# Patient Record
Sex: Female | Born: 1950 | Race: White | Hispanic: No | Marital: Married | Smoking: Never smoker
Health system: Southern US, Community
[De-identification: ages and names within clinical notes are randomized; demographics above are authoritative.]

## PROBLEM LIST (undated history)

## (undated) DIAGNOSIS — E119 Type 2 diabetes mellitus without complications: Secondary | ICD-10-CM

## (undated) DIAGNOSIS — G629 Polyneuropathy, unspecified: Secondary | ICD-10-CM

## (undated) DIAGNOSIS — E7800 Pure hypercholesterolemia, unspecified: Secondary | ICD-10-CM

## (undated) DIAGNOSIS — F419 Anxiety disorder, unspecified: Secondary | ICD-10-CM

## (undated) DIAGNOSIS — E139 Other specified diabetes mellitus without complications: Secondary | ICD-10-CM

## (undated) DIAGNOSIS — F32A Depression, unspecified: Secondary | ICD-10-CM

## (undated) DIAGNOSIS — L900 Lichen sclerosus et atrophicus: Secondary | ICD-10-CM

## (undated) DIAGNOSIS — K219 Gastro-esophageal reflux disease without esophagitis: Secondary | ICD-10-CM

## (undated) DIAGNOSIS — N952 Postmenopausal atrophic vaginitis: Secondary | ICD-10-CM

## (undated) HISTORY — PX: ABDOMINAL HYSTERECTOMY: SHX81

## (undated) HISTORY — DX: Depression, unspecified: F32.A

## (undated) HISTORY — PX: TONSILLECTOMY: SUR1361

## (undated) HISTORY — DX: Pure hypercholesterolemia, unspecified: E78.00

## (undated) HISTORY — DX: Gastro-esophageal reflux disease without esophagitis: K21.9

## (undated) HISTORY — DX: Postmenopausal atrophic vaginitis: N95.2

## (undated) HISTORY — DX: Type 2 diabetes mellitus without complications: E11.9

## (undated) HISTORY — DX: Polyneuropathy, unspecified: G62.9

## (undated) HISTORY — DX: Lichen sclerosus et atrophicus: L90.0

## (undated) HISTORY — DX: Anxiety disorder, unspecified: F41.9

---

## 2002-10-02 ENCOUNTER — Encounter: Admission: RE | Admit: 2002-10-02 | Discharge: 2002-10-02 | Payer: Self-pay | Admitting: Family Medicine

## 2002-10-02 ENCOUNTER — Encounter: Payer: Self-pay | Admitting: Family Medicine

## 2002-10-15 ENCOUNTER — Encounter: Admission: RE | Admit: 2002-10-15 | Discharge: 2003-01-13 | Payer: Self-pay | Admitting: Family Medicine

## 2003-02-04 ENCOUNTER — Encounter: Admission: RE | Admit: 2003-02-04 | Discharge: 2003-05-05 | Payer: Self-pay | Admitting: Family Medicine

## 2004-02-26 ENCOUNTER — Ambulatory Visit (HOSPITAL_COMMUNITY): Admission: RE | Admit: 2004-02-26 | Discharge: 2004-02-26 | Payer: Self-pay | Admitting: Family Medicine

## 2004-07-14 ENCOUNTER — Encounter: Admission: RE | Admit: 2004-07-14 | Discharge: 2004-07-14 | Payer: Self-pay | Admitting: Family Medicine

## 2006-03-22 ENCOUNTER — Ambulatory Visit (HOSPITAL_COMMUNITY): Admission: RE | Admit: 2006-03-22 | Discharge: 2006-03-22 | Payer: Self-pay | Admitting: Family Medicine

## 2008-06-11 ENCOUNTER — Ambulatory Visit (HOSPITAL_COMMUNITY): Admission: RE | Admit: 2008-06-11 | Discharge: 2008-06-11 | Payer: Self-pay | Admitting: Family Medicine

## 2010-02-10 ENCOUNTER — Other Ambulatory Visit: Admission: RE | Admit: 2010-02-10 | Discharge: 2010-02-10 | Payer: Self-pay | Admitting: Family Medicine

## 2010-02-12 ENCOUNTER — Ambulatory Visit (HOSPITAL_COMMUNITY): Admission: RE | Admit: 2010-02-12 | Discharge: 2010-02-12 | Payer: Self-pay | Admitting: Family Medicine

## 2012-08-24 ENCOUNTER — Other Ambulatory Visit (HOSPITAL_COMMUNITY): Payer: Self-pay | Admitting: Physician Assistant

## 2012-08-24 DIAGNOSIS — Z1231 Encounter for screening mammogram for malignant neoplasm of breast: Secondary | ICD-10-CM

## 2012-09-03 ENCOUNTER — Ambulatory Visit (HOSPITAL_COMMUNITY): Admission: RE | Admit: 2012-09-03 | Payer: Self-pay | Source: Ambulatory Visit

## 2012-09-06 ENCOUNTER — Ambulatory Visit (HOSPITAL_COMMUNITY): Payer: Self-pay

## 2012-09-07 ENCOUNTER — Ambulatory Visit (HOSPITAL_COMMUNITY)
Admission: RE | Admit: 2012-09-07 | Discharge: 2012-09-07 | Disposition: A | Discharge status: Home / Self Care | Payer: 59 | Source: Ambulatory Visit | Attending: Physician Assistant | Admitting: Physician Assistant

## 2012-09-07 DIAGNOSIS — Z1231 Encounter for screening mammogram for malignant neoplasm of breast: Secondary | ICD-10-CM | POA: Insufficient documentation

## 2013-08-21 ENCOUNTER — Other Ambulatory Visit (HOSPITAL_COMMUNITY): Payer: Self-pay | Admitting: Physician Assistant

## 2013-08-21 DIAGNOSIS — Z1231 Encounter for screening mammogram for malignant neoplasm of breast: Secondary | ICD-10-CM

## 2013-09-09 ENCOUNTER — Other Ambulatory Visit (HOSPITAL_COMMUNITY): Payer: Self-pay | Admitting: Physician Assistant

## 2013-09-09 ENCOUNTER — Ambulatory Visit (HOSPITAL_COMMUNITY)
Admission: RE | Admit: 2013-09-09 | Discharge: 2013-09-09 | Disposition: A | Discharge status: Home / Self Care | Payer: 59 | Source: Ambulatory Visit | Attending: Physician Assistant | Admitting: Physician Assistant

## 2013-09-09 DIAGNOSIS — Z1231 Encounter for screening mammogram for malignant neoplasm of breast: Secondary | ICD-10-CM

## 2013-10-28 ENCOUNTER — Other Ambulatory Visit: Payer: Self-pay | Admitting: Orthopedic Surgery

## 2013-10-28 DIAGNOSIS — M25562 Pain in left knee: Secondary | ICD-10-CM

## 2013-10-30 ENCOUNTER — Ambulatory Visit
Admission: RE | Admit: 2013-10-30 | Discharge: 2013-10-30 | Disposition: A | Discharge status: Home / Self Care | Payer: No Typology Code available for payment source | Source: Ambulatory Visit | Attending: Orthopedic Surgery | Admitting: Orthopedic Surgery

## 2013-10-30 DIAGNOSIS — M25562 Pain in left knee: Secondary | ICD-10-CM

## 2014-04-22 DIAGNOSIS — Z0000 Encounter for general adult medical examination without abnormal findings: Secondary | ICD-10-CM | POA: Diagnosis not present

## 2014-04-22 DIAGNOSIS — F329 Major depressive disorder, single episode, unspecified: Secondary | ICD-10-CM | POA: Diagnosis not present

## 2014-04-22 DIAGNOSIS — E119 Type 2 diabetes mellitus without complications: Secondary | ICD-10-CM | POA: Diagnosis not present

## 2014-04-22 DIAGNOSIS — E7800 Pure hypercholesterolemia, unspecified: Secondary | ICD-10-CM | POA: Diagnosis not present

## 2014-04-22 DIAGNOSIS — Z23 Encounter for immunization: Secondary | ICD-10-CM | POA: Diagnosis not present

## 2014-04-22 DIAGNOSIS — F411 Generalized anxiety disorder: Secondary | ICD-10-CM | POA: Diagnosis not present

## 2014-05-29 ENCOUNTER — Encounter (HOSPITAL_COMMUNITY): Payer: Self-pay | Admitting: Emergency Medicine

## 2014-05-29 ENCOUNTER — Emergency Department (HOSPITAL_COMMUNITY): Payer: Medicare Other

## 2014-05-29 ENCOUNTER — Emergency Department (HOSPITAL_COMMUNITY)
Admission: EM | Admit: 2014-05-29 | Discharge: 2014-05-29 | Disposition: A | Discharge status: Home / Self Care | Payer: Medicare Other | Attending: Emergency Medicine | Admitting: Emergency Medicine

## 2014-05-29 DIAGNOSIS — R609 Edema, unspecified: Secondary | ICD-10-CM | POA: Insufficient documentation

## 2014-05-29 DIAGNOSIS — E119 Type 2 diabetes mellitus without complications: Secondary | ICD-10-CM | POA: Diagnosis not present

## 2014-05-29 DIAGNOSIS — H538 Other visual disturbances: Secondary | ICD-10-CM | POA: Diagnosis not present

## 2014-05-29 DIAGNOSIS — R079 Chest pain, unspecified: Secondary | ICD-10-CM

## 2014-05-29 DIAGNOSIS — M791 Myalgia: Secondary | ICD-10-CM | POA: Insufficient documentation

## 2014-05-29 DIAGNOSIS — H539 Unspecified visual disturbance: Secondary | ICD-10-CM

## 2014-05-29 DIAGNOSIS — R51 Headache: Secondary | ICD-10-CM | POA: Insufficient documentation

## 2014-05-29 DIAGNOSIS — N644 Mastodynia: Secondary | ICD-10-CM | POA: Diagnosis not present

## 2014-05-29 DIAGNOSIS — R110 Nausea: Secondary | ICD-10-CM | POA: Insufficient documentation

## 2014-05-29 DIAGNOSIS — R42 Dizziness and giddiness: Secondary | ICD-10-CM | POA: Diagnosis not present

## 2014-05-29 DIAGNOSIS — R05 Cough: Secondary | ICD-10-CM | POA: Diagnosis not present

## 2014-05-29 DIAGNOSIS — Z79899 Other long term (current) drug therapy: Secondary | ICD-10-CM | POA: Insufficient documentation

## 2014-05-29 HISTORY — DX: Other specified diabetes mellitus without complications: E13.9

## 2014-05-29 LAB — PRO B NATRIURETIC PEPTIDE: Pro B Natriuretic peptide (BNP): 54.5 pg/mL (ref 0–125)

## 2014-05-29 LAB — COMPREHENSIVE METABOLIC PANEL
ALT: 16 U/L (ref 0–35)
AST: 21 U/L (ref 0–37)
Albumin: 3.7 g/dL (ref 3.5–5.2)
Alkaline Phosphatase: 73 U/L (ref 39–117)
Anion gap: 16 — ABNORMAL HIGH (ref 5–15)
BUN: 15 mg/dL (ref 6–23)
CO2: 23 mEq/L (ref 19–32)
Calcium: 8.8 mg/dL (ref 8.4–10.5)
Chloride: 102 mEq/L (ref 96–112)
Creatinine, Ser: 0.83 mg/dL (ref 0.50–1.10)
GFR calc Af Amer: 84 mL/min — ABNORMAL LOW (ref >90)
GFR calc non Af Amer: 72 mL/min — ABNORMAL LOW (ref >90)
Glucose, Bld: 216 mg/dL — ABNORMAL HIGH (ref 70–99)
Potassium: 3.4 mEq/L — ABNORMAL LOW (ref 3.7–5.3)
Sodium: 141 mEq/L (ref 137–147)
Total Bilirubin: 0.2 mg/dL — ABNORMAL LOW (ref 0.3–1.2)
Total Protein: 7 g/dL (ref 6.0–8.3)

## 2014-05-29 LAB — URINALYSIS, ROUTINE W REFLEX MICROSCOPIC
Bilirubin Urine: NEGATIVE
Glucose, UA: NEGATIVE mg/dL
Ketones, ur: NEGATIVE mg/dL
Leukocytes, UA: NEGATIVE
Nitrite: NEGATIVE
Protein, ur: NEGATIVE mg/dL
Specific Gravity, Urine: 1.003 — ABNORMAL LOW (ref 1.005–1.030)
Urobilinogen, UA: 0.2 mg/dL (ref 0.0–1.0)
pH: 6 (ref 5.0–8.0)

## 2014-05-29 LAB — I-STAT TROPONIN, ED: Troponin i, poc: 0.01 ng/mL (ref 0.00–0.08)

## 2014-05-29 LAB — URINE MICROSCOPIC-ADD ON

## 2014-05-29 LAB — CBC
HCT: 35.3 % — ABNORMAL LOW (ref 36.0–46.0)
Hemoglobin: 11.9 g/dL — ABNORMAL LOW (ref 12.0–15.0)
MCH: 32.1 pg (ref 26.0–34.0)
MCHC: 33.7 g/dL (ref 30.0–36.0)
MCV: 95.1 fL (ref 78.0–100.0)
Platelets: 203 10*3/uL (ref 150–400)
RBC: 3.71 MIL/uL — ABNORMAL LOW (ref 3.87–5.11)
RDW: 13.3 % (ref 11.5–15.5)
WBC: 6.3 10*3/uL (ref 4.0–10.5)

## 2014-05-29 NOTE — Discharge Instructions (Signed)
Dizziness °Dizziness is a common problem. It is a feeling of unsteadiness or light-headedness. You may feel like you are about to faint. Dizziness can lead to injury if you stumble or fall. A person of any age group can suffer from dizziness, but dizziness is more common in older adults. °CAUSES  °Dizziness can be caused by many different things, including: °· Middle ear problems. °· Standing for too long. °· Infections. °· An allergic reaction. °· Aging. °· An emotional response to something, such as the sight of blood. °· Side effects of medicines. °· Tiredness. °· Problems with circulation or blood pressure. °· Excessive use of alcohol or medicines, or illegal drug use. °· Breathing too fast (hyperventilation). °· An irregular heart rhythm (arrhythmia). °· A low red blood cell count (anemia). °· Pregnancy. °· Vomiting, diarrhea, fever, or other illnesses that cause body fluid loss (dehydration). °· Diseases or conditions such as Parkinson's disease, high blood pressure (hypertension), diabetes, and thyroid problems. °· Exposure to extreme heat. °DIAGNOSIS  °Your health care provider will ask about your symptoms, perform a physical exam, and perform an electrocardiogram (ECG) to record the electrical activity of your heart. Your health care provider may also perform other heart or blood tests to determine the cause of your dizziness. These may include: °· Transthoracic echocardiogram (TTE). During echocardiography, sound waves are used to evaluate how blood flows through your heart. °· Transesophageal echocardiogram (TEE). °· Cardiac monitoring. This allows your health care provider to monitor your heart rate and rhythm in real time. °· Holter monitor. This is a portable device that records your heartbeat and can help diagnose heart arrhythmias. It allows your health care provider to track your heart activity for several days if needed. °· Stress tests by exercise or by giving medicine that makes the heart beat  faster. °TREATMENT  °Treatment of dizziness depends on the cause of your symptoms and can vary greatly. °HOME CARE INSTRUCTIONS  °· Drink enough fluids to keep your urine clear or pale yellow. This is especially important in very hot weather. In older adults, it is also important in cold weather. °· Take your medicine exactly as directed if your dizziness is caused by medicines. When taking blood pressure medicines, it is especially important to get up slowly. °¨ Rise slowly from chairs and steady yourself until you feel okay. °¨ In the morning, first sit up on the side of the bed. When you feel okay, stand slowly while holding onto something until you know your balance is fine. °· Move your legs often if you need to stand in one place for a long time. Tighten and relax your muscles in your legs while standing. °· Have someone stay with you for 1-2 days if dizziness continues to be a problem. Do this until you feel you are well enough to stay alone. Have the person call your health care provider if he or she notices changes in you that are concerning. °· Do not drive or use heavy machinery if you feel dizzy. °· Do not drink alcohol. °SEEK IMMEDIATE MEDICAL CARE IF:  °· Your dizziness or light-headedness gets worse. °· You feel nauseous or vomit. °· You have problems talking, walking, or using your arms, hands, or legs. °· You feel weak. °· You are not thinking clearly or you have trouble forming sentences. It may take a friend or family member to notice this. °· You have chest pain, abdominal pain, shortness of breath, or sweating. °· Your vision changes. °· You notice   any bleeding.  You have side effects from medicine that seems to be getting worse rather than better. MAKE SURE YOU:   Understand these instructions.  Will watch your condition.  Will get help right away if you are not doing well or get worse. Document Released: 01/11/2001 Document Revised: 07/23/2013 Document Reviewed: 02/04/2011 [facility]  Patient Information 2015 [place], [place]. This information is not intended to replace advice given to you by your health care provider. Make sure you discuss any questions you have with your health care provider. Chest Wall Pain Chest wall pain is pain in or around the bones and muscles of your chest. It may take up to 6 weeks to get better. It may take longer if you must stay physically active in your work and activities.  CAUSES  Chest wall pain may happen on its own. However, it may be caused by:  A viral illness like the flu.  Injury.  Coughing.  Exercise.  Arthritis.  Fibromyalgia.  Shingles. HOME CARE INSTRUCTIONS   Avoid overtiring physical activity. Try not to strain or perform activities that cause pain. This includes any activities using your chest or your abdominal and side muscles, especially if heavy weights are used.  Put ice on the sore area.  Put ice in a plastic bag.  Place a towel between your skin and the bag.  Leave the ice on for 15-20 minutes per hour while awake for the first 2 days.  Only take over-the-counter or prescription medicines for pain, discomfort, or fever as directed by your caregiver. SEEK IMMEDIATE MEDICAL CARE IF:   Your pain increases, or you are very uncomfortable.  You have a fever.  Your chest pain becomes worse.  You have new, unexplained symptoms.  You have nausea or vomiting.  You feel sweaty or lightheaded.  You have a cough with phlegm (sputum), or you cough up blood. MAKE SURE YOU:   Understand these instructions.  Will watch your condition.  Will get help right away if you are not doing well or get worse. Document Released: 07/18/2005 Document Revised: 10/10/2011 Document Reviewed: 03/14/2011 [facility] Patient Information 2015 [place], [place]. This information is not intended to replace advice given to you by your health care provider. Make sure you discuss any questions you have with your health care  provider. Visual Disturbances You have had a disturbance in your vision. This may be caused by various conditions, such as:  Migraines. Migraine headaches are often preceded by a disturbance in vision. Blind spots or light flashes are followed by a headache. This type of visual disturbance is temporary. It does not damage the eye.  Glaucoma. This is caused by increased pressure in the eye. Symptoms include haziness, blurred vision, or seeing rainbow colored circles when looking at bright lights. Partial or complete visual loss can occur. You may or may not experience eye pain. Visual loss may be gradual or sudden and is irreversible. Glaucoma is the leading cause of blindness.  Retina problems. Vision will be reduced if the retina becomes detached or if there is a circulation problem as with diabetes, high blood pressure, or a mini-stroke. Symptoms include seeing "floaters," flashes of light, or shadows, as if a curtain has fallen over your eye.  Optic nerve problems. The main nerve in your eye can be damaged by redness, soreness, and swelling (inflammation), poor circulation, drugs, and toxins. It is very important to have a complete exam done by a specialist to determine the exact cause of your eye problem.  The specialist may recommend medicines or surgery, depending on the cause of the problem. This can help prevent further loss of vision or reduce the risk of having a stroke. Contact the caregiver to whom you have been referred and arrange for follow-up care right away. SEEK IMMEDIATE MEDICAL CARE IF:   Your vision gets worse.  You develop severe headaches.  You have any weakness or numbness in the face, arms, or legs.  You have any trouble speaking or walking. Document Released: 08/25/2004 Document Revised: 10/10/2011 Document Reviewed: 12/16/2009 [facility] Patient Information 2015 [place], [place]. This information is not intended to replace advice given to you by your health care provider.  Make sure you discuss any questions you have with your health care provider.

## 2014-05-29 NOTE — ED Notes (Signed)
Per pt she is experiencing some dizziness, headache, and pain under her right breast.

## 2014-05-29 NOTE — ED Provider Notes (Signed)
CSN: [id number]     Arrival date & time 05/29/14  [id number] History   First MD Initiated Contact with Patient 05/29/14 0840     Chief Complaint  Patient presents with  . Dizziness  . Breast Pain     (Consider location/radiation/quality/duration/timing/severity/associated sxs/prior Treatment) Patient is a 63 y.o. female presenting with dizziness. The history is provided by the patient.  Dizziness Quality:  Lightheadedness Severity:  Moderate Onset quality:  Gradual Duration:  2 days Timing:  Constant Progression:  Worsening Chronicity:  New Context: bending over   Relieved by:  Nothing Worsened by:  Nothing tried Ineffective treatments:  None tried Associated symptoms: headaches, nausea and vision changes   Risk factors: anemia   Risk factors: no hx of stroke and no new medications   Pt reports she was looking at black pants yesterday and they looked purple paisley print to her.  Pt reports she saw spots of brown in water and thought something was wrong with water.  Pt reports he husband did not see the spots.  Pt reports she has felt dizzy.  Pt reports floor feels elevated.  Pt reports she walked into door facing.    Pt also complains of soreness under her right breast.    Past Medical History  Diagnosis Date  . Diabetes 1.5, managed as type 2    Past Surgical History  Procedure Laterality Date  . Tonsillectomy    . Abdominal hysterectomy     No family history on file. History  Substance Use Topics  . Smoking status: Not on file  . Smokeless tobacco: Not on file  . Alcohol Use: Not on file   OB History   Grav Para Term Preterm Abortions TAB SAB Ect Mult Living                 Review of Systems  Gastrointestinal: Positive for nausea.  Musculoskeletal: Positive for myalgias.  Neurological: Positive for dizziness and headaches.  All other systems reviewed and are negative.     Allergies  Codeine  Home Medications   Prior to Admission medications   Medication  Sig Start Date End Date Taking? Authorizing Provider  buPROPion (WELLBUTRIN XL) 300 MG 24 hr tablet Take 300 mg by mouth daily.   Yes Historical Provider, MD  ibuprofen (ADVIL,MOTRIN) 200 MG tablet Take 200 mg by mouth every 6 (six) hours as needed (pain).   Yes Historical Provider, MD  metFORMIN (GLUCOPHAGE) 500 MG tablet Take 500 mg by mouth 2 (two) times daily with a meal.   Yes Historical Provider, MD  omeprazole (PRILOSEC) 40 MG capsule Take 40 mg by mouth daily.   Yes Historical Provider, MD  simvastatin (ZOCOR) 40 MG tablet Take 40 mg by mouth daily.   Yes Historical Provider, MD  sitaGLIPtin (JANUVIA) 100 MG tablet Take 100 mg by mouth daily.   Yes Historical Provider, MD   BP 150/62  Pulse 84  Temp(Src) 97.8 F (36.6 C) (Oral)  Resp 18  SpO2 96% Physical Exam  Nursing note and vitals reviewed. Constitutional: She is oriented to person, place, and time. She appears well-developed and well-nourished.  HENT:  Head: Normocephalic.  Right Ear: External ear normal.  Left Ear: External ear normal.  Nose: Nose normal.  Mouth/Throat: Oropharynx is clear and moist.  Eyes: Conjunctivae and EOM are normal. Pupils are equal, round, and reactive to light.  Neck: Normal range of motion.  Cardiovascular: Normal rate, regular rhythm and normal heart sounds.   Pulmonary/Chest: Effort normal  and breath sounds normal.  Abdominal: She exhibits no distension.  Musculoskeletal: She exhibits edema. She exhibits no tenderness.  Slight edema bilat feet  Neurological: She is alert and oriented to person, place, and time.  Skin: Skin is warm.  Psychiatric: She has a normal mood and affect.    ED Course  Procedures (including critical care time) Labs Review Labs Reviewed  CBC - Abnormal; Notable for the following:    RBC 3.71 (*)    Hemoglobin 11.9 (*)    HCT 35.3 (*)    All other components within normal limits  COMPREHENSIVE METABOLIC PANEL - Abnormal; Notable for the following:     Potassium 3.4 (*)    Glucose, Bld 216 (*)    Total Bilirubin 0.2 (*)    GFR calc non Af Amer 72 (*)    GFR calc Af Amer 84 (*)    Anion gap 16 (*)    All other components within normal limits  URINALYSIS, ROUTINE W REFLEX MICROSCOPIC - Abnormal; Notable for the following:    Specific Gravity, Urine 1.003 (*)    Hgb urine dipstick SMALL (*)    All other components within normal limits  PRO B NATRIURETIC PEPTIDE  URINE MICROSCOPIC-ADD ON  Rosezena Sensor, ED    Imaging Review Dg Chest 2 View  05/29/2014   CLINICAL DATA:  63 year old female with chest pain under both breasts (right greater than left). Cough for the past 2 weeks.  EXAM: CHEST  2 VIEW  COMPARISON:  Chest x-ray 07/15/2010.  FINDINGS: Lung volumes are normal. No consolidative airspace disease. No pleural effusions. No pneumothorax. No pulmonary nodule or mass noted. Pulmonary vasculature and the cardiomediastinal silhouette are within normal limits.  IMPRESSION: No radiographic evidence of acute cardiopulmonary disease.   Electronically Signed   By: Trudie Reed M.D.   On: 05/29/2014 09:39   Mr Brain Wo Contrast  05/29/2014   CLINICAL DATA:  Dizziness, headache, and pain under RIGHT breast. Initial encounter.  EXAM: MRI HEAD WITHOUT CONTRAST  TECHNIQUE: Multiplanar, multiecho pulse sequences of the brain and surrounding structures were obtained without intravenous contrast.  COMPARISON:  None.  FINDINGS: The patient was unable to remain motionless for the exam. Small or subtle lesions could be overlooked.  No evidence for acute infarction, hemorrhage, mass lesion, hydrocephalus, or extra-axial fluid. Normal for age cerebral volume. Mild subcortical and periventricular T2 and FLAIR hyperintensities, likely chronic microvascular ischemic change.  Pituitary, pineal, and cerebellar tonsils unremarkable. No upper cervical lesions. Flow voids are maintained throughout the carotid, basilar, and vertebral arteries. There are no areas of  chronic hemorrhage.  Visualized calvarium, skull base, and upper cervical osseous structures unremarkable. Scalp and extracranial soft tissues, orbits, sinuses, and mastoids show no acute process.  IMPRESSION: Mild age-related atrophy. Mild subcortical and periventricular T2 and FLAIR hyperintensities, likely chronic microvascular ischemic change.  No acute intracranial findings.   Electronically Signed   By: Davonna Belling M.D.   On: 05/29/2014 12:57     EKG Interpretation   Date/Time:  Thursday May 29 2014 08:44:29 EDT Ventricular Rate:  84 PR Interval:  188 QRS Duration: 84 QT Interval:  402 QTC Calculation: 475 R Axis:   48 Text Interpretation:  Sinus rhythm Low voltage, precordial leads Abnormal  R-wave progression, early transition No old tracing to compare Confirmed  by [facility]  MD, ELLIOTT [phone]) on 05/29/2014 9:55:35 AM     Results for orders placed during the hospital encounter of 05/29/14  CBC  Result Value Ref Range   WBC 6.3  4.0 - 10.5 K/uL   RBC 3.71 (*) 3.87 - 5.11 MIL/uL   Hemoglobin 11.9 (*) 12.0 - 15.0 g/dL   HCT 09.8 (*) 11.9 - 14.7 %   MCV 95.1  78.0 - 100.0 fL   MCH 32.1  26.0 - 34.0 pg   MCHC 33.7  30.0 - 36.0 g/dL   RDW 82.9  56.2 - 13.0 %   Platelets 203  150 - 400 K/uL  PRO B NATRIURETIC PEPTIDE      Result Value Ref Range   Pro B Natriuretic peptide (BNP) 54.5  0 - 125 pg/mL  COMPREHENSIVE METABOLIC PANEL      Result Value Ref Range   Sodium 141  137 - 147 mEq/L   Potassium 3.4 (*) 3.7 - 5.3 mEq/L   Chloride 102  96 - 112 mEq/L   CO2 23  19 - 32 mEq/L   Glucose, Bld 216 (*) 70 - 99 mg/dL   BUN 15  6 - 23 mg/dL   Creatinine, Ser 8.65  0.50 - 1.10 mg/dL   Calcium 8.8  8.4 - 78.4 mg/dL   Total Protein 7.0  6.0 - 8.3 g/dL   Albumin 3.7  3.5 - 5.2 g/dL   AST 21  0 - 37 U/L   ALT 16  0 - 35 U/L   Alkaline Phosphatase 73  39 - 117 U/L   Total Bilirubin 0.2 (*) 0.3 - 1.2 mg/dL   GFR calc non Af Amer 72 (*) >90 mL/min   GFR calc Af Amer 84 (*)  >90 mL/min   Anion gap 16 (*) 5 - 15  URINALYSIS, ROUTINE W REFLEX MICROSCOPIC      Result Value Ref Range   Color, Urine YELLOW  YELLOW   APPearance CLEAR  CLEAR   Specific Gravity, Urine 1.003 (*) 1.005 - 1.030   pH 6.0  5.0 - 8.0   Glucose, UA NEGATIVE  NEGATIVE mg/dL   Hgb urine dipstick SMALL (*) NEGATIVE   Bilirubin Urine NEGATIVE  NEGATIVE   Ketones, ur NEGATIVE  NEGATIVE mg/dL   Protein, ur NEGATIVE  NEGATIVE mg/dL   Urobilinogen, UA 0.2  0.0 - 1.0 mg/dL   Nitrite NEGATIVE  NEGATIVE   Leukocytes, UA NEGATIVE  NEGATIVE  URINE MICROSCOPIC-ADD ON      Result Value Ref Range   Squamous Epithelial / LPF RARE  RARE   RBC / HPF 0-2  <3 RBC/hpf   Bacteria, UA RARE  RARE  I-STAT TROPOININ, ED      Result Value Ref Range   Troponin i, poc 0.01  0.00 - 0.08 ng/mL   Comment 3            Dg Chest 2 View  05/29/2014   CLINICAL DATA:  63 year old female with chest pain under both breasts (right greater than left). Cough for the past 2 weeks.  EXAM: CHEST  2 VIEW  COMPARISON:  Chest x-ray 07/15/2010.  FINDINGS: Lung volumes are normal. No consolidative airspace disease. No pleural effusions. No pneumothorax. No pulmonary nodule or mass noted. Pulmonary vasculature and the cardiomediastinal silhouette are within normal limits.  IMPRESSION: No radiographic evidence of acute cardiopulmonary disease.   Electronically Signed   By: Trudie Reed M.D.   On: 05/29/2014 09:39   Mr Brain Wo Contrast  05/29/2014   CLINICAL DATA:  Dizziness, headache, and pain under RIGHT breast. Initial encounter.  EXAM: MRI HEAD WITHOUT CONTRAST  TECHNIQUE: Multiplanar,  multiecho pulse sequences of the brain and surrounding structures were obtained without intravenous contrast.  COMPARISON:  None.  FINDINGS: The patient was unable to remain motionless for the exam. Small or subtle lesions could be overlooked.  No evidence for acute infarction, hemorrhage, mass lesion, hydrocephalus, or extra-axial fluid. Normal for  age cerebral volume. Mild subcortical and periventricular T2 and FLAIR hyperintensities, likely chronic microvascular ischemic change.  Pituitary, pineal, and cerebellar tonsils unremarkable. No upper cervical lesions. Flow voids are maintained throughout the carotid, basilar, and vertebral arteries. There are no areas of chronic hemorrhage.  Visualized calvarium, skull base, and upper cervical osseous structures unremarkable. Scalp and extracranial soft tissues, orbits, sinuses, and mastoids show no acute process.  IMPRESSION: Mild age-related atrophy. Mild subcortical and periventricular T2 and FLAIR hyperintensities, likely chronic microvascular ischemic change.  No acute intracranial findings.   Electronically Signed   By: Davonna BellingJohn  Curnes M.D.   On: 05/29/2014 12:57    MDM  Mria noraml   Glucose elevated at 216 Dr. Effie Shywentz in to see and examine.  Pt advised to follow up with her primary MD for recheck of blood pressure in 1 week.  Pt advised to see her Eye doctor for vision check.     Final diagnoses:  Chest pain  Visual changes       Elson AreasLeslie K Dewan Emond, [place] JerseyPA-C 05/29/14 1422

## 2014-05-29 NOTE — ED Provider Notes (Deleted)
Medical screening examination/treatment/procedure(s) were performed by non-physician practitioner and as supervising physician I was immediately available for consultation/collaboration.  Kevan Prouty L Malkia Nippert, MD 05/29/14 1723 

## 2014-05-29 NOTE — ED Provider Notes (Signed)
  Face-to-face evaluation   History: She reports onset of visual abnormality, yesterday her to rise by shapes and colors that are really there. She also noticed some soreness of her right anterior chest wall, today.   Physical exam: Alert, somewhat anxious, cooperative. No respiratory distress. Moves extremities equally.  Medical screening examination/treatment/procedure(s) were conducted as a shared visit with non-physician practitioner(s) and myself.  I personally evaluated the patient during the encounter  Flint MelterElliott L Teka Chanda, MD 05/29/14 1725

## 2014-06-05 DIAGNOSIS — R112 Nausea with vomiting, unspecified: Secondary | ICD-10-CM | POA: Diagnosis not present

## 2014-06-05 DIAGNOSIS — H539 Unspecified visual disturbance: Secondary | ICD-10-CM | POA: Diagnosis not present

## 2014-06-05 DIAGNOSIS — R42 Dizziness and giddiness: Secondary | ICD-10-CM | POA: Diagnosis not present

## 2014-10-21 DIAGNOSIS — F329 Major depressive disorder, single episode, unspecified: Secondary | ICD-10-CM | POA: Diagnosis not present

## 2014-10-21 DIAGNOSIS — Z01411 Encounter for gynecological examination (general) (routine) with abnormal findings: Secondary | ICD-10-CM | POA: Diagnosis not present

## 2014-10-21 DIAGNOSIS — F419 Anxiety disorder, unspecified: Secondary | ICD-10-CM | POA: Diagnosis not present

## 2014-10-21 DIAGNOSIS — Z0000 Encounter for general adult medical examination without abnormal findings: Secondary | ICD-10-CM | POA: Diagnosis not present

## 2014-10-21 DIAGNOSIS — Z6837 Body mass index (BMI) 37.0-37.9, adult: Secondary | ICD-10-CM | POA: Diagnosis not present

## 2014-10-21 DIAGNOSIS — K219 Gastro-esophageal reflux disease without esophagitis: Secondary | ICD-10-CM | POA: Diagnosis not present

## 2014-10-21 DIAGNOSIS — E780 Pure hypercholesterolemia: Secondary | ICD-10-CM | POA: Diagnosis not present

## 2014-10-21 DIAGNOSIS — E669 Obesity, unspecified: Secondary | ICD-10-CM | POA: Diagnosis not present

## 2014-10-21 DIAGNOSIS — Z1239 Encounter for other screening for malignant neoplasm of breast: Secondary | ICD-10-CM | POA: Diagnosis not present

## 2014-10-21 DIAGNOSIS — E119 Type 2 diabetes mellitus without complications: Secondary | ICD-10-CM | POA: Diagnosis not present

## 2014-11-19 DIAGNOSIS — N958 Other specified menopausal and perimenopausal disorders: Secondary | ICD-10-CM | POA: Diagnosis not present

## 2015-01-20 DIAGNOSIS — E119 Type 2 diabetes mellitus without complications: Secondary | ICD-10-CM | POA: Diagnosis not present

## 2015-01-20 DIAGNOSIS — Z794 Long term (current) use of insulin: Secondary | ICD-10-CM | POA: Diagnosis not present

## 2015-03-30 ENCOUNTER — Other Ambulatory Visit (HOSPITAL_COMMUNITY): Payer: Self-pay | Admitting: Physician Assistant

## 2015-03-30 DIAGNOSIS — Z1231 Encounter for screening mammogram for malignant neoplasm of breast: Secondary | ICD-10-CM

## 2015-04-01 ENCOUNTER — Ambulatory Visit (HOSPITAL_COMMUNITY)
Admission: RE | Admit: 2015-04-01 | Discharge: 2015-04-01 | Disposition: A | Discharge status: Home / Self Care | Payer: Medicare Other | Source: Ambulatory Visit | Attending: Physician Assistant | Admitting: Physician Assistant

## 2015-04-01 DIAGNOSIS — Z1231 Encounter for screening mammogram for malignant neoplasm of breast: Secondary | ICD-10-CM | POA: Diagnosis not present

## 2015-04-23 DIAGNOSIS — Z794 Long term (current) use of insulin: Secondary | ICD-10-CM | POA: Diagnosis not present

## 2015-04-23 DIAGNOSIS — Z23 Encounter for immunization: Secondary | ICD-10-CM | POA: Diagnosis not present

## 2015-04-23 DIAGNOSIS — E780 Pure hypercholesterolemia: Secondary | ICD-10-CM | POA: Diagnosis not present

## 2015-04-23 DIAGNOSIS — F411 Generalized anxiety disorder: Secondary | ICD-10-CM | POA: Diagnosis not present

## 2015-04-23 DIAGNOSIS — E1165 Type 2 diabetes mellitus with hyperglycemia: Secondary | ICD-10-CM | POA: Diagnosis not present

## 2015-04-23 DIAGNOSIS — F329 Major depressive disorder, single episode, unspecified: Secondary | ICD-10-CM | POA: Diagnosis not present

## 2015-12-01 DIAGNOSIS — F329 Major depressive disorder, single episode, unspecified: Secondary | ICD-10-CM | POA: Diagnosis not present

## 2015-12-01 DIAGNOSIS — K219 Gastro-esophageal reflux disease without esophagitis: Secondary | ICD-10-CM | POA: Diagnosis not present

## 2015-12-01 DIAGNOSIS — E7800 Pure hypercholesterolemia, unspecified: Secondary | ICD-10-CM | POA: Diagnosis not present

## 2015-12-01 DIAGNOSIS — E119 Type 2 diabetes mellitus without complications: Secondary | ICD-10-CM | POA: Diagnosis not present

## 2016-04-05 DIAGNOSIS — Z23 Encounter for immunization: Secondary | ICD-10-CM | POA: Diagnosis not present

## 2016-06-06 DIAGNOSIS — Z0000 Encounter for general adult medical examination without abnormal findings: Secondary | ICD-10-CM | POA: Diagnosis not present

## 2016-06-06 DIAGNOSIS — E7800 Pure hypercholesterolemia, unspecified: Secondary | ICD-10-CM | POA: Diagnosis not present

## 2016-06-06 DIAGNOSIS — F419 Anxiety disorder, unspecified: Secondary | ICD-10-CM | POA: Diagnosis not present

## 2016-06-06 DIAGNOSIS — K219 Gastro-esophageal reflux disease without esophagitis: Secondary | ICD-10-CM | POA: Diagnosis not present

## 2016-06-06 DIAGNOSIS — E119 Type 2 diabetes mellitus without complications: Secondary | ICD-10-CM | POA: Diagnosis not present

## 2016-06-06 DIAGNOSIS — F329 Major depressive disorder, single episode, unspecified: Secondary | ICD-10-CM | POA: Diagnosis not present

## 2016-07-27 DIAGNOSIS — Z1211 Encounter for screening for malignant neoplasm of colon: Secondary | ICD-10-CM | POA: Diagnosis not present

## 2016-07-27 DIAGNOSIS — K5730 Diverticulosis of large intestine without perforation or abscess without bleeding: Secondary | ICD-10-CM | POA: Diagnosis not present

## 2016-07-27 DIAGNOSIS — Z8371 Family history of colonic polyps: Secondary | ICD-10-CM | POA: Diagnosis not present

## 2016-07-27 DIAGNOSIS — K640 First degree hemorrhoids: Secondary | ICD-10-CM | POA: Diagnosis not present

## 2016-08-29 DIAGNOSIS — H109 Unspecified conjunctivitis: Secondary | ICD-10-CM | POA: Diagnosis not present

## 2016-11-29 DIAGNOSIS — E119 Type 2 diabetes mellitus without complications: Secondary | ICD-10-CM | POA: Diagnosis not present

## 2016-11-29 DIAGNOSIS — H43393 Other vitreous opacities, bilateral: Secondary | ICD-10-CM | POA: Diagnosis not present

## 2016-11-29 DIAGNOSIS — H2513 Age-related nuclear cataract, bilateral: Secondary | ICD-10-CM | POA: Diagnosis not present

## 2016-12-08 DIAGNOSIS — E7800 Pure hypercholesterolemia, unspecified: Secondary | ICD-10-CM | POA: Diagnosis not present

## 2016-12-08 DIAGNOSIS — E119 Type 2 diabetes mellitus without complications: Secondary | ICD-10-CM | POA: Diagnosis not present

## 2016-12-08 DIAGNOSIS — K219 Gastro-esophageal reflux disease without esophagitis: Secondary | ICD-10-CM | POA: Diagnosis not present

## 2016-12-08 DIAGNOSIS — F419 Anxiety disorder, unspecified: Secondary | ICD-10-CM | POA: Diagnosis not present

## 2016-12-08 DIAGNOSIS — F329 Major depressive disorder, single episode, unspecified: Secondary | ICD-10-CM | POA: Diagnosis not present

## 2017-03-15 DIAGNOSIS — Z794 Long term (current) use of insulin: Secondary | ICD-10-CM | POA: Diagnosis not present

## 2017-03-15 DIAGNOSIS — Z7984 Long term (current) use of oral hypoglycemic drugs: Secondary | ICD-10-CM | POA: Diagnosis not present

## 2017-03-15 DIAGNOSIS — E119 Type 2 diabetes mellitus without complications: Secondary | ICD-10-CM | POA: Diagnosis not present

## 2017-04-14 DIAGNOSIS — Z23 Encounter for immunization: Secondary | ICD-10-CM | POA: Diagnosis not present

## 2017-06-14 ENCOUNTER — Ambulatory Visit
Admission: RE | Admit: 2017-06-14 | Discharge: 2017-06-14 | Disposition: A | Discharge status: Home / Self Care | Payer: Medicare Other | Source: Ambulatory Visit | Attending: Physician Assistant | Admitting: Physician Assistant

## 2017-06-14 ENCOUNTER — Other Ambulatory Visit: Payer: Self-pay | Admitting: Physician Assistant

## 2017-06-14 DIAGNOSIS — E7800 Pure hypercholesterolemia, unspecified: Secondary | ICD-10-CM | POA: Diagnosis not present

## 2017-06-14 DIAGNOSIS — Z1231 Encounter for screening mammogram for malignant neoplasm of breast: Secondary | ICD-10-CM

## 2017-06-14 DIAGNOSIS — E1165 Type 2 diabetes mellitus with hyperglycemia: Secondary | ICD-10-CM | POA: Diagnosis not present

## 2017-06-14 DIAGNOSIS — F329 Major depressive disorder, single episode, unspecified: Secondary | ICD-10-CM | POA: Diagnosis not present

## 2017-06-14 DIAGNOSIS — Z1159 Encounter for screening for other viral diseases: Secondary | ICD-10-CM | POA: Diagnosis not present

## 2017-06-14 DIAGNOSIS — F419 Anxiety disorder, unspecified: Secondary | ICD-10-CM | POA: Diagnosis not present

## 2017-06-14 DIAGNOSIS — Z0000 Encounter for general adult medical examination without abnormal findings: Secondary | ICD-10-CM | POA: Diagnosis not present

## 2017-06-14 DIAGNOSIS — Z23 Encounter for immunization: Secondary | ICD-10-CM | POA: Diagnosis not present

## 2017-06-14 DIAGNOSIS — Z794 Long term (current) use of insulin: Secondary | ICD-10-CM | POA: Diagnosis not present

## 2017-06-14 DIAGNOSIS — Z780 Asymptomatic menopausal state: Secondary | ICD-10-CM | POA: Diagnosis not present

## 2017-06-14 DIAGNOSIS — K219 Gastro-esophageal reflux disease without esophagitis: Secondary | ICD-10-CM | POA: Diagnosis not present

## 2017-06-16 ENCOUNTER — Other Ambulatory Visit: Payer: Self-pay | Admitting: Physician Assistant

## 2017-06-16 DIAGNOSIS — Z139 Encounter for screening, unspecified: Secondary | ICD-10-CM

## 2017-08-17 DIAGNOSIS — J069 Acute upper respiratory infection, unspecified: Secondary | ICD-10-CM | POA: Diagnosis not present

## 2017-10-16 DIAGNOSIS — H6061 Unspecified chronic otitis externa, right ear: Secondary | ICD-10-CM | POA: Diagnosis not present

## 2017-10-16 DIAGNOSIS — H6521 Chronic serous otitis media, right ear: Secondary | ICD-10-CM | POA: Diagnosis not present

## 2017-10-16 DIAGNOSIS — H6691 Otitis media, unspecified, right ear: Secondary | ICD-10-CM | POA: Diagnosis not present

## 2017-10-16 DIAGNOSIS — H6121 Impacted cerumen, right ear: Secondary | ICD-10-CM | POA: Diagnosis not present

## 2017-10-20 DIAGNOSIS — H6121 Impacted cerumen, right ear: Secondary | ICD-10-CM | POA: Diagnosis not present

## 2017-12-12 DIAGNOSIS — Z794 Long term (current) use of insulin: Secondary | ICD-10-CM | POA: Diagnosis not present

## 2017-12-12 DIAGNOSIS — E1169 Type 2 diabetes mellitus with other specified complication: Secondary | ICD-10-CM | POA: Diagnosis not present

## 2017-12-12 DIAGNOSIS — F329 Major depressive disorder, single episode, unspecified: Secondary | ICD-10-CM | POA: Diagnosis not present

## 2017-12-12 DIAGNOSIS — F419 Anxiety disorder, unspecified: Secondary | ICD-10-CM | POA: Diagnosis not present

## 2017-12-12 DIAGNOSIS — K219 Gastro-esophageal reflux disease without esophagitis: Secondary | ICD-10-CM | POA: Diagnosis not present

## 2017-12-12 DIAGNOSIS — E7800 Pure hypercholesterolemia, unspecified: Secondary | ICD-10-CM | POA: Diagnosis not present

## 2018-02-20 DIAGNOSIS — E1169 Type 2 diabetes mellitus with other specified complication: Secondary | ICD-10-CM | POA: Diagnosis not present

## 2018-02-20 DIAGNOSIS — S99929A Unspecified injury of unspecified foot, initial encounter: Secondary | ICD-10-CM | POA: Diagnosis not present

## 2018-02-20 DIAGNOSIS — T798XXA Other early complications of trauma, initial encounter: Secondary | ICD-10-CM | POA: Diagnosis not present

## 2018-02-20 DIAGNOSIS — Z7984 Long term (current) use of oral hypoglycemic drugs: Secondary | ICD-10-CM | POA: Diagnosis not present

## 2018-03-09 ENCOUNTER — Other Ambulatory Visit: Payer: Self-pay | Admitting: Family Medicine

## 2018-03-09 ENCOUNTER — Ambulatory Visit
Admission: RE | Admit: 2018-03-09 | Discharge: 2018-03-09 | Disposition: A | Discharge status: Home / Self Care | Payer: Medicare Other | Source: Ambulatory Visit | Attending: Family Medicine | Admitting: Family Medicine

## 2018-03-09 DIAGNOSIS — M79672 Pain in left foot: Secondary | ICD-10-CM

## 2018-03-09 DIAGNOSIS — L089 Local infection of the skin and subcutaneous tissue, unspecified: Secondary | ICD-10-CM | POA: Diagnosis not present

## 2018-03-09 DIAGNOSIS — S99922A Unspecified injury of left foot, initial encounter: Secondary | ICD-10-CM | POA: Diagnosis not present

## 2018-03-20 DIAGNOSIS — B373 Candidiasis of vulva and vagina: Secondary | ICD-10-CM | POA: Diagnosis not present

## 2018-03-20 DIAGNOSIS — N760 Acute vaginitis: Secondary | ICD-10-CM | POA: Diagnosis not present

## 2018-04-26 ENCOUNTER — Ambulatory Visit (INDEPENDENT_AMBULATORY_CARE_PROVIDER_SITE_OTHER): Payer: Medicare Other | Admitting: Orthopedic Surgery

## 2018-04-26 ENCOUNTER — Ambulatory Visit (INDEPENDENT_AMBULATORY_CARE_PROVIDER_SITE_OTHER): Payer: Medicare Other

## 2018-04-26 ENCOUNTER — Encounter (INDEPENDENT_AMBULATORY_CARE_PROVIDER_SITE_OTHER): Payer: Self-pay | Admitting: Orthopedic Surgery

## 2018-04-26 DIAGNOSIS — M25562 Pain in left knee: Principal | ICD-10-CM

## 2018-04-26 DIAGNOSIS — G8929 Other chronic pain: Secondary | ICD-10-CM

## 2018-04-30 ENCOUNTER — Encounter (INDEPENDENT_AMBULATORY_CARE_PROVIDER_SITE_OTHER): Payer: Self-pay | Admitting: Orthopedic Surgery

## 2018-04-30 DIAGNOSIS — G8929 Other chronic pain: Secondary | ICD-10-CM | POA: Diagnosis not present

## 2018-04-30 DIAGNOSIS — M25562 Pain in left knee: Secondary | ICD-10-CM | POA: Diagnosis not present

## 2018-04-30 MED ORDER — METHYLPREDNISOLONE ACETATE 40 MG/ML IJ SUSP
40.0000 mg | INTRAMUSCULAR | Status: AC | PRN
  Administered 2018-04-30: 40 mg via INTRA_ARTICULAR

## 2018-04-30 MED ORDER — LIDOCAINE HCL 1 % IJ SOLN
5.0000 mL | INTRAMUSCULAR | Status: AC | PRN
  Administered 2018-04-30: 5 mL

## 2018-04-30 NOTE — Progress Notes (Signed)
Office Visit Note   Patient: Dorothy David           Date of Birth: Jun 23, 1951           MRN: [id number] Visit Date: 04/26/2018              Requested by: Milus Height, PA-C [address] [place], [place] [address] PCP: Milus Height, PA-C  Chief Complaint  Patient presents with  . Left Knee - Pain      HPI: Patient is a 67 year old woman who complains of medial joint line left knee pain.  She states she has had pain for over a month she states the pain is worse going up and down stairs denies any specific injury.  She states she has pain with getting from a sitting to a standing position.  Assessment & Plan: Visit Diagnoses:  1. Chronic pain of left knee     Plan: Left knee was injected and follow-up in 4 weeks.  Discussed that if she still has medial joint line symptoms we would evaluate for an MRI scan to evaluate for medial meniscal tear.  Follow-Up Instructions: Return in about 4 weeks (around 05/24/2018).   Ortho Exam  Patient is alert, oriented, no adenopathy, well-dressed, normal affect, normal respiratory effort. Examination patient has difficulty getting from a sitting to a standing position.  Collaterals and cruciates are stable in the left knee there is no effusion no redness no cellulitis.  She is point tender to palpation of the medial joint line flexion and rotation is painful.  Imaging: No results found. No images are attached to the encounter.  Labs: No results found for: HGBA1C, ESRSEDRATE, CRP, LABURIC, REPTSTATUS, GRAMSTAIN, CULT, LABORGA   Lab Results  Component Value Date   ALBUMIN 3.7 05/29/2014    There is no height or weight on file to calculate BMI.  Orders:  Orders Placed This Encounter  Procedures  . XR KNEE 3 VIEW LEFT   No orders of the defined types were placed in this encounter.    Procedures: Large Joint Inj: L knee on 04/30/2018 8:04 AM Indications: pain and diagnostic evaluation Details: 22 G 1.5 in needle,  anteromedial approach  Arthrogram: No  Medications: 5 mL lidocaine 1 %; 40 mg methylPREDNISolone acetate 40 MG/ML Outcome: tolerated well, no immediate complications Procedure, treatment alternatives, risks and benefits explained, specific risks discussed. Consent was given by the patient. Immediately prior to procedure a time out was called to verify the correct patient, procedure, equipment, support staff and site/side marked as required. Patient was prepped and draped in the usual sterile fashion.      Clinical Data: No additional findings.  ROS:  All other systems negative, except as noted in the HPI. Review of Systems  Objective: Vital Signs: There were no vitals taken for this visit.  Specialty Comments:  No specialty comments available.  PMFS History: There are no active problems to display for this patient.  Past Medical History:  Diagnosis Date  . Diabetes 1.5, 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HCC)     Family History  Problem Relation Age of Onset  . Breast cancer Neg Hx     Past Surgical History:  Procedure Laterality Date  . ABDOMINAL HYSTERECTOMY    . TONSILLECTOMY     Social History   Occupational History  . Not on file  Tobacco Use  . Smoking status: Never Smoker  . Smokeless tobacco: Never Used  Substance and Sexual Activity  . Alcohol  use: Not on file  . Drug use: Not on file  . Sexual activity: Not on file

## 2018-05-17 ENCOUNTER — Ambulatory Visit (INDEPENDENT_AMBULATORY_CARE_PROVIDER_SITE_OTHER): Payer: Medicare Other | Admitting: Orthopedic Surgery

## 2018-06-21 DIAGNOSIS — Z0000 Encounter for general adult medical examination without abnormal findings: Secondary | ICD-10-CM | POA: Diagnosis not present

## 2018-06-21 DIAGNOSIS — F322 Major depressive disorder, single episode, severe without psychotic features: Secondary | ICD-10-CM | POA: Diagnosis not present

## 2018-06-21 DIAGNOSIS — Z794 Long term (current) use of insulin: Secondary | ICD-10-CM | POA: Diagnosis not present

## 2018-06-21 DIAGNOSIS — E7800 Pure hypercholesterolemia, unspecified: Secondary | ICD-10-CM | POA: Diagnosis not present

## 2018-06-21 DIAGNOSIS — Z23 Encounter for immunization: Secondary | ICD-10-CM | POA: Diagnosis not present

## 2018-06-21 DIAGNOSIS — N952 Postmenopausal atrophic vaginitis: Secondary | ICD-10-CM | POA: Diagnosis not present

## 2018-06-21 DIAGNOSIS — E1169 Type 2 diabetes mellitus with other specified complication: Secondary | ICD-10-CM | POA: Diagnosis not present

## 2018-06-21 DIAGNOSIS — F419 Anxiety disorder, unspecified: Secondary | ICD-10-CM | POA: Diagnosis not present

## 2018-06-21 DIAGNOSIS — K219 Gastro-esophageal reflux disease without esophagitis: Secondary | ICD-10-CM | POA: Diagnosis not present

## 2018-06-21 DIAGNOSIS — M79674 Pain in right toe(s): Secondary | ICD-10-CM | POA: Diagnosis not present

## 2018-07-04 DIAGNOSIS — E119 Type 2 diabetes mellitus without complications: Secondary | ICD-10-CM | POA: Diagnosis not present

## 2019-10-21 ENCOUNTER — Ambulatory Visit: Payer: Medicare Other

## 2020-04-24 ENCOUNTER — Other Ambulatory Visit: Payer: Self-pay | Admitting: Physician Assistant

## 2020-04-24 ENCOUNTER — Ambulatory Visit
Admission: RE | Admit: 2020-04-24 | Discharge: 2020-04-24 | Disposition: A | Discharge status: Home / Self Care | Payer: Medicare Other | Source: Ambulatory Visit | Attending: Physician Assistant | Admitting: Physician Assistant

## 2020-04-24 ENCOUNTER — Other Ambulatory Visit: Payer: Self-pay

## 2020-04-24 DIAGNOSIS — Z1231 Encounter for screening mammogram for malignant neoplasm of breast: Secondary | ICD-10-CM

## 2020-08-03 DIAGNOSIS — E1169 Type 2 diabetes mellitus with other specified complication: Secondary | ICD-10-CM | POA: Diagnosis not present

## 2020-08-03 DIAGNOSIS — E7800 Pure hypercholesterolemia, unspecified: Secondary | ICD-10-CM | POA: Diagnosis not present

## 2020-08-11 DIAGNOSIS — L900 Lichen sclerosus et atrophicus: Secondary | ICD-10-CM | POA: Diagnosis not present

## 2020-08-11 DIAGNOSIS — B379 Candidiasis, unspecified: Secondary | ICD-10-CM | POA: Diagnosis not present

## 2020-09-04 ENCOUNTER — Other Ambulatory Visit: Payer: Self-pay

## 2020-09-08 ENCOUNTER — Ambulatory Visit: Payer: Medicare Other | Admitting: Endocrinology

## 2020-09-08 ENCOUNTER — Other Ambulatory Visit: Payer: Self-pay

## 2020-09-08 ENCOUNTER — Encounter: Payer: Self-pay | Admitting: Endocrinology

## 2020-09-08 DIAGNOSIS — Z794 Long term (current) use of insulin: Secondary | ICD-10-CM

## 2020-09-08 DIAGNOSIS — E119 Type 2 diabetes mellitus without complications: Secondary | ICD-10-CM

## 2020-09-08 MED ORDER — METFORMIN HCL ER 500 MG PO TB24: 500.0000 mg | ORAL_TABLET | Freq: Every day | ORAL | 3 refills | Status: DC

## 2020-09-08 MED ORDER — LEVEMIR FLEXTOUCH 100 UNIT/ML ~~LOC~~ SOPN: 50.0000 [IU] | PEN_INJECTOR | SUBCUTANEOUS | 11 refills | Status: DC

## 2020-09-08 NOTE — Patient Instructions (Addendum)
good diet and exercise significantly improve the control of your diabetes.  please let me know if you wish to be referred to a dietician.  high blood sugar is very risky to your health.  you should see an eye doctor and dentist every year.  It is very important to get all recommended vaccinations.  Controlling your blood pressure and cholesterol drastically reduces the damage diabetes does to your body.  Those who smoke should quit.  Please discuss these with your doctor.  check your blood sugar twice a day.  vary the time of day when you check, between before the 3 meals, and at bedtime.  also check if you have symptoms of your blood sugar being too high or too low.  please keep a record of the readings and bring it to your next appointment here (or you can bring the meter itself).  You can write it on any piece of paper.  please call us sooner if your blood sugar goes below 70, or if you have a lot of readings over 200.   We will need to take this complex situation in stages For now, please: Increase the Levemir to 50 units, and change it to the morning, and: I have sent a prescription to your pharmacy, to change metformin to extended-release. Please continue the same Tradjenta. Please come back for a follow-up appointment in 1 month.

## 2020-09-08 NOTE — Progress Notes (Signed)
Subjective:    Patient ID: Dorothy David, female    DOB: 03/26/1951, 70 y.o.   MRN: [id number]  HPI pt is referred by Milus Height, PA, for diabetes.  Pt states DM was dx'ed in 2002; she is unaware of any chronic complications; she has been on insulin since dx; pt says her diet and exercise are not good; she has never had pancreatitis, pancreatic surgery, severe hypoglycemia or DKA.  she takes Levemir 48 units QHS, Tradjenta, and metformin.  She had glycosuria during 1972 pregnancy.  Pt says fasting cbg's vary from 112-200.  Dosage of metformin is limited by nausea and diarrhea.  She stopped Rybelsus, due to nausea.   Past Medical History:  Diagnosis Date  . Diabetes 1.5, managed as type 2 [facility])     Past Surgical History:  Procedure Laterality Date  . ABDOMINAL HYSTERECTOMY    . TONSILLECTOMY      Social History   Socioeconomic History  . Marital status: Married    Spouse name: Not on file  . Number of children: Not on file  . Years of education: Not on file  . Highest education level: Not on file  Occupational History  . Not on file  Tobacco Use  . Smoking status: Never Smoker  . Smokeless tobacco: Never Used  Substance and Sexual Activity  . Alcohol use: Not on file  . Drug use: Not on file  . Sexual activity: Not on file  Other Topics Concern  . Not on file  Social History Narrative  . Not on file   Social Determinants of Health   Financial Resource Strain: Not on file  Food Insecurity: Not on file  Transportation Needs: Not on file  Physical Activity: Not on file  Stress: Not on file  Social Connections: Not on file  Intimate Partner Violence: Not on file    Current Outpatient Medications on File Prior to Visit  Medication Sig Dispense Refill  . buPROPion (WELLBUTRIN XL) 300 MG 24 hr tablet Take 300 mg by mouth daily.    . clobetasol ointment (TEMOVATE) 0.05 % SMARTSIG:1 Topical Every Night    . ibuprofen (ADVIL,MOTRIN) 200 MG tablet Take 200 mg by mouth  every 6 (six) hours as needed (pain).    Marland Kitchen omeprazole (PRILOSEC) 40 MG capsule Take 40 mg by mouth daily.    . rosuvastatin (CRESTOR) 10 MG tablet Take 10 mg by mouth daily.    . simvastatin (ZOCOR) 40 MG tablet Take 40 mg by mouth daily.    . TRADJENTA 5 MG TABS tablet Take 5 mg by mouth every morning.     No current facility-administered medications on file prior to visit.    Allergies  Allergen Reactions  . Codeine     Hot flashes    Family History  Problem Relation Age of Onset  . Breast cancer Neg Hx     BP 132/68   Pulse (!) 52   Ht 5\' 3"  (1.6 m)   Wt 165 lb (74.8 kg)   SpO2 97%   BMI 29.23 kg/m    Review of Systems denies weight loss, blurry vision, chest pain, sob, n/v, urinary frequency, and memory loss.      Objective:   Physical Exam VITAL SIGNS:  See vs page GENERAL: no distress Pulses: dorsalis pedis intact bilat.   MSK: no deformity of the feet CV: trace bilat leg edema, and bilat vv's Skin:  no ulcer on the feet.  normal color and temp on  the feet. Neuro: sensation is intact to touch on the feet  outside test results are reviewed: A1c=8.3%  I have reviewed outside records, and summarized: Pt was noted to have elevated A1c, and referred here.  Anxiety, PPN, and dyslipidemia were also addressed.    Assessment & Plan:  Insulin-requiring type 2 DM: uncontrolled.    Patient Instructions  good diet and exercise significantly improve the control of your diabetes.  please let me know if you wish to be referred to a dietician.  high blood sugar is very risky to your health.  you should see an eye doctor and dentist every year.  It is very important to get all recommended vaccinations.  Controlling your blood pressure and cholesterol drastically reduces the damage diabetes does to your body.  Those who smoke should quit.  Please discuss these with your doctor.  check your blood sugar twice a day.  vary the time of day when you check, between before the 3  meals, and at bedtime.  also check if you have symptoms of your blood sugar being too high or too low.  please keep a record of the readings and bring it to your next appointment here (or you can bring the meter itself).  You can write it on any piece of paper.  please call us sooner if your blood sugar goes below 70, or if you have a lot of readings over 200.   We will need to take this complex situation in stages For now, please: Increase the Levemir to 50 units, and change it to the morning, and: I have sent a prescription to your pharmacy, to change metformin to extended-release. Please continue the same Tradjenta. Please come back for a follow-up appointment in 1 month.

## 2020-09-12 DIAGNOSIS — E1165 Type 2 diabetes mellitus with hyperglycemia: Secondary | ICD-10-CM | POA: Insufficient documentation

## 2020-09-12 DIAGNOSIS — E119 Type 2 diabetes mellitus without complications: Secondary | ICD-10-CM | POA: Insufficient documentation

## 2020-10-07 ENCOUNTER — Ambulatory Visit: Payer: Medicare Other | Admitting: Endocrinology

## 2020-10-07 ENCOUNTER — Other Ambulatory Visit: Payer: Self-pay

## 2020-10-07 VITALS — BP 130/70 | HR 70 | Ht 63.0 in | Wt 164.0 lb

## 2020-10-07 DIAGNOSIS — E119 Type 2 diabetes mellitus without complications: Secondary | ICD-10-CM | POA: Diagnosis not present

## 2020-10-07 DIAGNOSIS — Z794 Long term (current) use of insulin: Secondary | ICD-10-CM | POA: Diagnosis not present

## 2020-10-07 LAB — POCT GLYCOSYLATED HEMOGLOBIN (HGB A1C): Hemoglobin A1C: 8 % — AB (ref 4.0–5.6)

## 2020-10-07 MED ORDER — LEVEMIR FLEXTOUCH 100 UNIT/ML ~~LOC~~ SOPN: 55.0000 [IU] | PEN_INJECTOR | SUBCUTANEOUS | 3 refills | Status: DC

## 2020-10-07 NOTE — Patient Instructions (Addendum)
check your blood sugar twice a day.  vary the time of day when you check, between before the 3 meals, and at bedtime.  also check if you have symptoms of your blood sugar being too high or too low.  please keep a record of the readings and bring it to your next appointment here (or you can bring the meter itself).  You can write it on any piece of paper.  please call us sooner if your blood sugar goes below 70, or if you have a lot of readings over 200.   Please increase the Levemir to 55 units each morning, and:  Please continue the same Tradjenta and metformin-XR.   Please come back for a follow-up appointment in 2 months.

## 2020-10-07 NOTE — Progress Notes (Signed)
Subjective:    Patient ID: Dorothy David, female    DOB: [birth year], 70 y.o.   MRN: [id number]  HPI Pt returns for f/u of diabetes mellitus: DM type: Insulin-requiring type 2 Dx'ed: 2002 Complications: none Therapy: insulin since GDM: never DKA: never Severe hypoglycemia: never Pancreatitis: never Pancreatic imaging: never SDOH: none Other: She did not tolerate Jardiance (vaginitis), or Rybelsus (nausea) Interval history: no cbg record, but states cbg's vary from 100-212.  She checks fasting only.  She misses the Levemir approx once per week, despite low copay.   Past Medical History:  Diagnosis Date  . Diabetes 1.5, managed as type 2 [facility]     Past Surgical History:  Procedure Laterality Date  . ABDOMINAL HYSTERECTOMY    . TONSILLECTOMY      Social History   Socioeconomic History  . Marital status: Married    Spouse name: Not on file  . Number of children: Not on file  . Years of education: Not on file  . Highest education level: Not on file  Occupational History  . Not on file  Tobacco Use  . Smoking status: Never Smoker  . Smokeless tobacco: Never Used  Substance and Sexual Activity  . Alcohol use: Not on file  . Drug use: Not on file  . Sexual activity: Not on file  Other Topics Concern  . Not on file  Social History Narrative  . Not on file   Social Determinants of Health   Financial Resource Strain: Not on file  Food Insecurity: Not on file  Transportation Needs: Not on file  Physical Activity: Not on file  Stress: Not on file  Social Connections: Not on file  Intimate Partner Violence: Not on file    Current Outpatient Medications on File Prior to Visit  Medication Sig Dispense Refill  . buPROPion (WELLBUTRIN XL) 300 MG 24 hr tablet Take 300 mg by mouth daily.    . clobetasol ointment (TEMOVATE) 0.05 % SMARTSIG:1 Topical Every Night    . ibuprofen (ADVIL,MOTRIN) 200 MG tablet Take 200 mg by mouth every 6 (six) hours as needed (pain).    .  metFORMIN (GLUCOPHAGE-XR) 500 MG 24 hr tablet Take 1 tablet (500 mg total) by mouth daily. 90 tablet 3  . omeprazole (PRILOSEC) 40 MG capsule Take 40 mg by mouth daily.    . rosuvastatin (CRESTOR) 10 MG tablet Take 10 mg by mouth daily.    . simvastatin (ZOCOR) 40 MG tablet Take 40 mg by mouth daily.    . TRADJENTA 5 MG TABS tablet Take 5 mg by mouth every morning.     No current facility-administered medications on file prior to visit.    Allergies  Allergen Reactions  . Codeine     Hot flashes    Family History  Problem Relation Age of Onset  . Breast cancer Neg Hx     BP 130/70 (BP Location: Right Arm, Patient Position: Sitting, Cuff Size: Large)   Pulse 70   Ht 5\' 3"  (1.6 m)   Wt 164 lb (74.4 kg)   SpO2 95%   BMI 29.05 kg/m    Review of Systems She denies hypoglycemia/n/v    Objective:   Physical Exam VITAL SIGNS:  See vs page GENERAL: no distress Pulses: dorsalis pedis intact bilat.   MSK: no deformity of the feet CV: 1+ bilat leg edema, and bilat vv's Skin:  no ulcer on the feet.  normal color and temp on the feet. Neuro: sensation is  intact to touch on the feet Ext: there is bilateral onychomycosis of the toenails.    Lab Results  Component Value Date   HGBA1C 8.0 (A) 10/07/2020        Assessment & Plan:  Insulin-requiring type 2 DM: uncertain etiology and prognosis Noncompliance with insulin: she is not a candidate for multiple daily injections.    Patient Instructions  check your blood sugar twice a day.  vary the time of day when you check, between before the 3 meals, and at bedtime.  also check if you have symptoms of your blood sugar being too high or too low.  please keep a record of the readings and bring it to your next appointment here (or you can bring the meter itself).  You can write it on any piece of paper.  please call us sooner if your blood sugar goes below 70, or if you have a lot of readings over 200.   Please increase the Levemir to  55 units each morning, and:  Please continue the same Tradjenta and metformin-XR.   Please come back for a follow-up appointment in 2 months.

## 2020-10-22 DIAGNOSIS — E1169 Type 2 diabetes mellitus with other specified complication: Secondary | ICD-10-CM | POA: Diagnosis not present

## 2020-10-22 DIAGNOSIS — Z0000 Encounter for general adult medical examination without abnormal findings: Secondary | ICD-10-CM | POA: Diagnosis not present

## 2020-10-22 DIAGNOSIS — G629 Polyneuropathy, unspecified: Secondary | ICD-10-CM | POA: Diagnosis not present

## 2020-10-22 DIAGNOSIS — E7800 Pure hypercholesterolemia, unspecified: Secondary | ICD-10-CM | POA: Diagnosis not present

## 2020-10-22 DIAGNOSIS — K219 Gastro-esophageal reflux disease without esophagitis: Secondary | ICD-10-CM | POA: Diagnosis not present

## 2020-12-08 ENCOUNTER — Ambulatory Visit: Payer: Medicare Other | Admitting: Endocrinology

## 2020-12-08 ENCOUNTER — Other Ambulatory Visit: Payer: Self-pay

## 2020-12-08 VITALS — BP 140/80 | HR 72 | Ht 63.0 in | Wt 167.0 lb

## 2020-12-08 DIAGNOSIS — E119 Type 2 diabetes mellitus without complications: Secondary | ICD-10-CM | POA: Diagnosis not present

## 2020-12-08 DIAGNOSIS — Z794 Long term (current) use of insulin: Secondary | ICD-10-CM

## 2020-12-08 LAB — POCT GLYCOSYLATED HEMOGLOBIN (HGB A1C): Hemoglobin A1C: 8 % — AB (ref 4.0–5.6)

## 2020-12-08 MED ORDER — LEVEMIR FLEXTOUCH 100 UNIT/ML ~~LOC~~ SOPN: 57.0000 [IU] | PEN_INJECTOR | SUBCUTANEOUS | 3 refills | Status: DC

## 2020-12-08 NOTE — Progress Notes (Signed)
Subjective:    Patient ID: Dorothy David, female    DOB: Sep 21, 1950, 70 y.o.   MRN: [id number]  HPI Pt returns for f/u of diabetes mellitus: DM type: Insulin-requiring type 2 Dx'ed: 2002 Complications: none Therapy: insulin since GDM: never DKA: never Severe hypoglycemia: never Pancreatitis: never Pancreatic imaging: never SDOH: none Other: She did not tolerate Jardiance (vaginitis), or Rybelsus (nausea).   Interval history: no cbg record, but states cbg's vary from 95-200.  She checks fasting only.  She now seldom misses the Levemir.   Past Medical History:  Diagnosis Date  . Diabetes 1.5, managed as type 2 [facility])     Past Surgical History:  Procedure Laterality Date  . ABDOMINAL HYSTERECTOMY    . TONSILLECTOMY      Social History   Socioeconomic History  . Marital status: Married    Spouse name: Not on file  . Number of children: Not on file  . Years of education: Not on file  . Highest education level: Not on file  Occupational History  . Not on file  Tobacco Use  . Smoking status: Never Smoker  . Smokeless tobacco: Never Used  Substance and Sexual Activity  . Alcohol use: Not on file  . Drug use: Not on file  . Sexual activity: Not on file  Other Topics Concern  . Not on file  Social History Narrative  . Not on file   Social Determinants of Health   Financial Resource Strain: Not on file  Food Insecurity: Not on file  Transportation Needs: Not on file  Physical Activity: Not on file  Stress: Not on file  Social Connections: Not on file  Intimate Partner Violence: Not on file    Current Outpatient Medications on File Prior to Visit  Medication Sig Dispense Refill  . buPROPion (WELLBUTRIN XL) 300 MG 24 hr tablet Take 300 mg by mouth daily.    . clobetasol ointment (TEMOVATE) 0.05 % SMARTSIG:1 Topical Every Night    . ibuprofen (ADVIL,MOTRIN) 200 MG tablet Take 200 mg by mouth every 6 (six) hours as needed (pain).    . metFORMIN (GLUCOPHAGE-XR) 500  MG 24 hr tablet Take 1 tablet (500 mg total) by mouth daily. 90 tablet 3  . omeprazole (PRILOSEC) 40 MG capsule Take 40 mg by mouth daily.    . rosuvastatin (CRESTOR) 10 MG tablet Take 10 mg by mouth daily.    . simvastatin (ZOCOR) 40 MG tablet Take 40 mg by mouth daily.    . TRADJENTA 5 MG TABS tablet Take 5 mg by mouth every morning.     No current facility-administered medications on file prior to visit.    Allergies  Allergen Reactions  . Codeine     Hot flashes    Family History  Problem Relation Age of Onset  . Breast cancer Neg Hx     BP 140/80 (BP Location: Right Arm, Patient Position: Sitting, Cuff Size: Large)   Pulse 72   Ht 5\' 3"  (1.6 m)   Wt 167 lb (75.8 kg)   SpO2 95%   BMI 29.58 kg/m    Review of Systems She denies hypoglycemia.      Objective:   Physical Exam VITAL SIGNS:  See vs page GENERAL: no distress Pulses: dorsalis pedis intact bilat.   MSK: no deformity of the feet CV: no leg edema.   Skin:  no ulcer on the feet.  normal color and temp on the feet. Neuro: sensation is intact to touch  on the feet.    A1c=8.0%     Assessment & Plan:  Insulin-requiring type 2 DM: uncontrolled  Patient Instructions  check your blood sugar twice a day.  vary the time of day when you check, between before the 3 meals, and at bedtime.  also check if you have symptoms of your blood sugar being too high or too low.  please keep a record of the readings and bring it to your next appointment here (or you can bring the meter itself).  You can write it on any piece of paper.  please call us sooner if your blood sugar goes below 70, or if you have a lot of readings over 200.   Please increase the Levemir to 57 units each morning, and:  Please continue the same Tradjenta and metformin-XR.   Please come back for a follow-up appointment in 2 months.

## 2020-12-08 NOTE — Patient Instructions (Addendum)
check your blood sugar twice a day.  vary the time of day when you check, between before the 3 meals, and at bedtime.  also check if you have symptoms of your blood sugar being too high or too low.  please keep a record of the readings and bring it to your next appointment here (or you can bring the meter itself).  You can write it on any piece of paper.  please call us sooner if your blood sugar goes below 70, or if you have a lot of readings over 200.   Please increase the Levemir to 57 units each morning, and:  Please continue the same Tradjenta and metformin-XR.   Please come back for a follow-up appointment in 2 months.

## 2021-02-11 DIAGNOSIS — L900 Lichen sclerosus et atrophicus: Secondary | ICD-10-CM | POA: Diagnosis not present

## 2021-02-16 ENCOUNTER — Ambulatory Visit: Payer: Medicare Other | Admitting: Endocrinology

## 2021-02-19 ENCOUNTER — Ambulatory Visit (INDEPENDENT_AMBULATORY_CARE_PROVIDER_SITE_OTHER): Payer: Medicare Other | Admitting: Endocrinology

## 2021-02-19 ENCOUNTER — Other Ambulatory Visit: Payer: Self-pay

## 2021-02-19 VITALS — BP 138/70 | HR 71 | Ht 63.0 in | Wt 171.6 lb

## 2021-02-19 DIAGNOSIS — E119 Type 2 diabetes mellitus without complications: Secondary | ICD-10-CM | POA: Diagnosis not present

## 2021-02-19 DIAGNOSIS — Z794 Long term (current) use of insulin: Secondary | ICD-10-CM | POA: Diagnosis not present

## 2021-02-19 LAB — POCT GLYCOSYLATED HEMOGLOBIN (HGB A1C): Hemoglobin A1C: 7.9 % — AB (ref 4.0–5.6)

## 2021-02-19 MED ORDER — LEVEMIR FLEXTOUCH 100 UNIT/ML ~~LOC~~ SOPN: 58.0000 [IU] | PEN_INJECTOR | SUBCUTANEOUS | 3 refills | Status: DC

## 2021-02-19 NOTE — Patient Instructions (Addendum)
check your blood sugar twice a day.  vary the time of day when you check, between before the 3 meals, and at bedtime.  also check if you have symptoms of your blood sugar being too high or too low.  please keep a record of the readings and bring it to your next appointment here (or you can bring the meter itself).  You can write it on any piece of paper.  please call us sooner if your blood sugar goes below 70, or if you have a lot of readings over 200.   Please increase the Levemir to 58 units each morning, and:  Please continue the same Tradjenta and metformin-XR.   Please come back for a follow-up appointment in 3 months.

## 2021-02-19 NOTE — Progress Notes (Signed)
Subjective:    Patient ID: Dorothy David, female    DOB: 1951-01-10, 70 y.o.   MRN: [id number]  HPI Pt returns for f/u of diabetes mellitus: DM type: Insulin-requiring type 2 Dx'ed: 2002 Complications: none Therapy: insulin since dx GDM: never DKA: never Severe hypoglycemia: never Pancreatitis: never Pancreatic imaging: never SDOH: none Other: She did not tolerate Jardiance (vaginitis), or Rybelsus (nausea);  she is not a candidate for multiple daily injections, due to noncompliance.   Interval history: no cbg record, but states cbg's vary from 75-200.  She checks fasting only.  She now seldom misses the Levemir.   Past Medical History:  Diagnosis Date   Diabetes 1.5, 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[facility])     Past Surgical History:  Procedure Laterality Date   ABDOMINAL HYSTERECTOMY     TONSILLECTOMY      Social History   Socioeconomic History   Marital status: Married    Spouse name: Not on file   Number of children: Not on file   Years of education: Not on file   Highest education level: Not on file  Occupational History   Not on file  Tobacco Use   Smoking status: Never   Smokeless tobacco: Never  Substance and Sexual Activity   Alcohol use: Not on file   Drug use: Not on file   Sexual activity: Not on file  Other Topics Concern   Not on file  Social History Narrative   Not on file   Social Determinants of Health   Financial Resource Strain: Not on file  Food Insecurity: Not on file  Transportation Needs: Not on file  Physical Activity: Not on file  Stress: Not on file  Social Connections: Not on file  Intimate Partner Violence: Not on file    Current Outpatient Medications on File Prior to Visit  Medication Sig Dispense Refill   buPROPion (WELLBUTRIN XL) 300 MG 24 hr tablet Take 300 mg by mouth daily.     clobetasol ointment (TEMOVATE) 0.05 % SMARTSIG:1 Topical Every Night     ibuprofen (ADVIL,MOTRIN) 200 MG tablet Take 200 mg by mouth every 6 (six) hours as  needed (pain).     metFORMIN (GLUCOPHAGE-XR) 500 MG 24 hr tablet Take 1 tablet (500 mg total) by mouth daily. 90 tablet 3   omeprazole (PRILOSEC) 40 MG capsule Take 40 mg by mouth daily.     rosuvastatin (CRESTOR) 10 MG tablet Take 10 mg by mouth daily.     simvastatin (ZOCOR) 40 MG tablet Take 40 mg by mouth daily.     TRADJENTA 5 MG TABS tablet Take 5 mg by mouth every morning.     No current facility-administered medications on file prior to visit.    Allergies  Allergen Reactions   Codeine     Hot flashes    Family History  Problem Relation Age of Onset   Breast cancer Neg Hx     BP 138/70 (BP Location: Right Arm, Patient Position: Sitting, Cuff Size: Large)   Pulse 71   Ht 5\' 3"  (1.6 m)   Wt 171 lb 9.6 oz (77.8 kg)   SpO2 96%   BMI 30.40 kg/m    Review of Systems She denies hypoglycemia.  Denies n/v    Objective:   Physical Exam Pulses: dorsalis pedis intact bilat.   MSK: no deformity of the feet CV: trace bilat leg edema Skin:  no ulcer on the feet.  normal color and temp on the feet. Neuro:  sensation is intact to touch on the feet.     Lab Results  Component Value Date   HGBA1C 7.9 (A) 02/19/2021   Lab Results  Component Value Date   CREATININE 0.83 05/29/2014   BUN 15 05/29/2014   NA 141 05/29/2014   K 3.4 (L) 05/29/2014   CL 102 05/29/2014   CO2 23 05/29/2014      Assessment & Plan:  Insulin-requiring type 2 DM:  uncontrolled.  We discussed.  She wants to increase Levemir rather than metformin-XR.    Patient Instructions  check your blood sugar twice a day.  vary the time of day when you check, between before the 3 meals, and at bedtime.  also check if you have symptoms of your blood sugar being too high or too low.  please keep a record of the readings and bring it to your next appointment here (or you can bring the meter itself).  You can write it on any piece of paper.  please call us sooner if your blood sugar goes below 70, or if you have a  lot of readings over 200.   Please increase the Levemir to 58 units each morning, and:  Please continue the same Tradjenta and metformin-XR.   Please come back for a follow-up appointment in 3 months.

## 2021-05-02 DIAGNOSIS — E119 Type 2 diabetes mellitus without complications: Secondary | ICD-10-CM | POA: Diagnosis not present

## 2021-05-25 ENCOUNTER — Telehealth: Payer: Self-pay

## 2021-05-25 ENCOUNTER — Ambulatory Visit (INDEPENDENT_AMBULATORY_CARE_PROVIDER_SITE_OTHER): Payer: Medicare Other | Admitting: Endocrinology

## 2021-05-25 ENCOUNTER — Other Ambulatory Visit: Payer: Self-pay

## 2021-05-25 VITALS — BP 130/82 | HR 67 | Ht 63.0 in | Wt 173.0 lb

## 2021-05-25 DIAGNOSIS — Z794 Long term (current) use of insulin: Secondary | ICD-10-CM | POA: Diagnosis not present

## 2021-05-25 DIAGNOSIS — E119 Type 2 diabetes mellitus without complications: Secondary | ICD-10-CM | POA: Diagnosis not present

## 2021-05-25 LAB — POCT GLYCOSYLATED HEMOGLOBIN (HGB A1C): Hemoglobin A1C: 8.6 % — AB (ref 4.0–5.6)

## 2021-05-25 MED ORDER — FREESTYLE LIBRE 2 SENSOR MISC: 1.0000 | 3 refills | Status: DC

## 2021-05-25 MED ORDER — OZEMPIC (0.25 OR 0.5 MG/DOSE) 2 MG/1.5ML ~~LOC~~ SOPN: 0.5000 mg | PEN_INJECTOR | SUBCUTANEOUS | 3 refills | Status: DC

## 2021-05-25 MED ORDER — FREESTYLE LIBRE 2 READER DEVI: 1.0000 | Freq: Once | 1 refills | Status: AC

## 2021-05-25 NOTE — Progress Notes (Signed)
Subjective:    Patient ID: Dorothy David, female    DOB: 1950/09/16, 70 y.o.   MRN: [id number]  HPI Pt returns for f/u of diabetes mellitus: DM type: Insulin-requiring type 2 Dx'ed: 2002 Complications: none Therapy: insulin since dx GDM: never DKA: never Severe hypoglycemia: never Pancreatitis: never Pancreatic imaging: never SDOH: none Other: She did not tolerate Jardiance (vaginitis), or Rybelsus (nausea);  she is not a candidate for multiple daily injections, due to noncompliance.   Interval history:  She has not recently checked cbg.  She sometimes misses the Levemir.  She wants to re-try the Ozempic.   Past Medical History:  Diagnosis Date   Diabetes 1.5, 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[facility])     Past Surgical History:  Procedure Laterality Date   ABDOMINAL HYSTERECTOMY     TONSILLECTOMY      Social History   Socioeconomic History   Marital status: Married    Spouse name: Not on file   Number of children: Not on file   Years of education: Not on file   Highest education level: Not on file  Occupational History   Not on file  Tobacco Use   Smoking status: Never   Smokeless tobacco: Never  Substance and Sexual Activity   Alcohol use: Not on file   Drug use: Not on file   Sexual activity: Not on file  Other Topics Concern   Not on file  Social History Narrative   Not on file   Social Determinants of Health   Financial Resource Strain: Not on file  Food Insecurity: Not on file  Transportation Needs: Not on file  Physical Activity: Not on file  Stress: Not on file  Social Connections: Not on file  Intimate Partner Violence: Not on file    Current Outpatient Medications on File Prior to Visit  Medication Sig Dispense Refill   buPROPion (WELLBUTRIN XL) 300 MG 24 hr tablet Take 300 mg by mouth daily.     clobetasol ointment (TEMOVATE) 0.05 % SMARTSIG:1 Topical Every Night     ibuprofen (ADVIL,MOTRIN) 200 MG tablet Take 200 mg by mouth every 6 (six) hours as needed  (pain).     insulin detemir (LEVEMIR FLEXTOUCH) 100 UNIT/ML FlexPen Inject 58 Units into the skin every morning. And 31g 70mm pen needles 1/day 60 mL 3   metFORMIN (GLUCOPHAGE-XR) 500 MG 24 hr tablet Take 1 tablet (500 mg total) by mouth daily. 90 tablet 3   omeprazole (PRILOSEC) 40 MG capsule Take 40 mg by mouth daily.     rosuvastatin (CRESTOR) 10 MG tablet Take 10 mg by mouth daily.     simvastatin (ZOCOR) 40 MG tablet Take 40 mg by mouth daily.     No current facility-administered medications on file prior to visit.    Allergies  Allergen Reactions   Codeine     Hot flashes    Family History  Problem Relation Age of Onset   Breast cancer Neg Hx     BP 130/82 (BP Location: Right Arm, Patient Position: Sitting, Cuff Size: Large)   Pulse 67   Ht 5\' 3"  (1.6 m)   Wt 173 lb (78.5 kg)   SpO2 95%   BMI 30.65 kg/m    Review of Systems She denies hypoglycemia.     Objective:   Physical Exam Pulses: dorsalis pedis intact bilat.   MSK: no deformity of the feet.  CV: 1+ left and  trace right leg edema.   Skin:  no ulcer  on the feet, but there are bilat calluses.  normal color and temp on the feet.   Neuro: sensation is intact to touch on the feet.    Lab Results  Component Value Date   HGBA1C 8.6 (A) 05/25/2021      Assessment & Plan:  Insulin-requiring type 2 DM: uncontrolled.  therapy limited by noncompliance.    Patient Instructions  check your blood sugar twice a day.  vary the time of day when you check, between before the 3 meals, and at bedtime.  also check if you have symptoms of your blood sugar being too high or too low.  please keep a record of the readings and bring it to your next appointment here (or you can bring the meter itself).  You can write it on any piece of paper.  please call us sooner if your blood sugar goes below 70, or if you have a lot of readings over 200.   I have sent a prescription to your pharmacy, to change Tradjenta to Ozempic, and:   Please continue the same Levemir and metformin.  I have sent a prescription to your pharmacy, for continuous glucose monitor sensors and a reader.  Please call if you need help with this  Please come back for a follow-up appointment in 3 months.

## 2021-05-25 NOTE — Telephone Encounter (Signed)
I reached out to pt and her husband but was unable to speak to anyone or Lvm bc mail mailbox was not set up or either full to let them know that the pt's freestyle libre 2 was not covered by insurance so they would need to reach out to the company to find out if they cover another one and let us know.

## 2021-05-25 NOTE — Patient Instructions (Addendum)
check your blood sugar twice a day.  vary the time of day when you check, between before the 3 meals, and at bedtime.  also check if you have symptoms of your blood sugar being too high or too low.  please keep a record of the readings and bring it to your next appointment here (or you can bring the meter itself).  You can write it on any piece of paper.  please call us sooner if your blood sugar goes below 70, or if you have a lot of readings over 200.   I have sent a prescription to your pharmacy, to change Tradjenta to Ozempic, and:  Please continue the same Levemir and metformin.  I have sent a prescription to your pharmacy, for continuous glucose monitor sensors and a reader.  Please call if you need help with this  Please come back for a follow-up appointment in 3 months.

## 2021-05-26 ENCOUNTER — Other Ambulatory Visit: Payer: Self-pay | Admitting: Physician Assistant

## 2021-05-26 DIAGNOSIS — Z1231 Encounter for screening mammogram for malignant neoplasm of breast: Secondary | ICD-10-CM

## 2021-05-28 ENCOUNTER — Ambulatory Visit: Payer: Medicare Other

## 2021-06-01 ENCOUNTER — Ambulatory Visit
Admission: RE | Admit: 2021-06-01 | Discharge: 2021-06-01 | Disposition: A | Discharge status: Home / Self Care | Payer: Medicare Other | Source: Ambulatory Visit | Attending: Physician Assistant | Admitting: Physician Assistant

## 2021-06-01 ENCOUNTER — Other Ambulatory Visit: Payer: Self-pay

## 2021-06-01 DIAGNOSIS — Z23 Encounter for immunization: Secondary | ICD-10-CM | POA: Diagnosis not present

## 2021-06-01 DIAGNOSIS — Z1231 Encounter for screening mammogram for malignant neoplasm of breast: Secondary | ICD-10-CM

## 2021-06-16 ENCOUNTER — Telehealth: Payer: Self-pay | Admitting: Endocrinology

## 2021-06-16 NOTE — Telephone Encounter (Signed)
Patient has now been informed and expressed understanding.

## 2021-06-16 NOTE — Telephone Encounter (Signed)
Patient called re: Patient states she is Vomiting and having diarrhea since taking Semaglutide,0.25 or 0.5MG /DOS, (OZEMPIC, 0.25 OR 0.5 MG/DOSE,) 2 MG/1.5ML SOPN. Patient requests to be called at ph# [phone]

## 2021-08-23 DIAGNOSIS — L900 Lichen sclerosus et atrophicus: Secondary | ICD-10-CM | POA: Diagnosis not present

## 2021-08-25 ENCOUNTER — Ambulatory Visit (INDEPENDENT_AMBULATORY_CARE_PROVIDER_SITE_OTHER): Payer: Medicare Other | Admitting: Endocrinology

## 2021-08-25 ENCOUNTER — Other Ambulatory Visit: Payer: Self-pay

## 2021-08-25 VITALS — BP 148/90 | HR 108 | Ht 63.0 in | Wt 161.8 lb

## 2021-08-25 DIAGNOSIS — Z794 Long term (current) use of insulin: Secondary | ICD-10-CM

## 2021-08-25 DIAGNOSIS — E119 Type 2 diabetes mellitus without complications: Secondary | ICD-10-CM

## 2021-08-25 LAB — POCT GLYCOSYLATED HEMOGLOBIN (HGB A1C): Hemoglobin A1C: 8.6 % — AB (ref 4.0–5.6)

## 2021-08-25 MED ORDER — LEVEMIR FLEXTOUCH 100 UNIT/ML ~~LOC~~ SOPN: 70.0000 [IU] | PEN_INJECTOR | SUBCUTANEOUS | 3 refills | Status: DC

## 2021-08-25 NOTE — Progress Notes (Signed)
Subjective:    Patient ID: Dorothy David, female    DOB: 1951-03-21, 71 y.o.   MRN: [id number]  HPI Pt returns for f/u of diabetes mellitus: DM type: Insulin-requiring type 2 Dx'ed: 2002 Complications: none Therapy: insulin since dx, and metformin.   GDM: never DKA: never Severe hypoglycemia: never Pancreatitis: never Pancreatic imaging: never SDOH: she does not check cbg Other: She did not tolerate Jardiance (vaginitis), or Rybelsus (nausea);  she is not a candidate for multiple daily injections, due to noncompliance.   Interval history:  She sometimes misses the Levemir.  When she takes it, she takes 64 units qam.  She did not tolerate Ozempic (N/V).   Ins declined continuous glucose monitor.   Past Medical History:  Diagnosis Date   Diabetes 1.5, 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HCC)     Past Surgical History:  Procedure Laterality Date   ABDOMINAL HYSTERECTOMY     TONSILLECTOMY      Social History   Socioeconomic History   Marital status: Married    Spouse name: Not on file   Number of children: Not on file   Years of education: Not on file   Highest education level: Not on file  Occupational History   Not on file  Tobacco Use   Smoking status: Never   Smokeless tobacco: Never  Substance and Sexual Activity   Alcohol use: Not on file   Drug use: Not on file   Sexual activity: Not on file  Other Topics Concern   Not on file  Social History Narrative   Not on file   Social Determinants of Health   Financial Resource Strain: Not on file  Food Insecurity: Not on file  Transportation Needs: Not on file  Physical Activity: Not on file  Stress: Not on file  Social Connections: Not on file  Intimate Partner Violence: Not on file    Current Outpatient Medications on File Prior to Visit  Medication Sig Dispense Refill   buPROPion (WELLBUTRIN XL) 300 MG 24 hr tablet Take 300 mg by mouth daily.     clobetasol ointment (TEMOVATE) 0.05 % SMARTSIG:1 Topical Every Night      Continuous Blood Gluc Sensor (FREESTYLE LIBRE 2 SENSOR) MISC 1 Device by Does not apply route every 14 (fourteen) days. 6 each 3   ibuprofen (ADVIL,MOTRIN) 200 MG tablet Take 200 mg by mouth every 6 (six) hours as needed (pain).     metFORMIN (GLUCOPHAGE-XR) 500 MG 24 hr tablet Take 1 tablet (500 mg total) by mouth daily. 90 tablet 3   omeprazole (PRILOSEC) 40 MG capsule Take 40 mg by mouth daily.     rosuvastatin (CRESTOR) 10 MG tablet Take 10 mg by mouth daily.     simvastatin (ZOCOR) 40 MG tablet Take 40 mg by mouth daily.     No current facility-administered medications on file prior to visit.    Allergies  Allergen Reactions   Codeine     Hot flashes    Family History  Problem Relation Age of Onset   Breast cancer Neg Hx     BP (!) 148/90    Pulse (!) 108    Ht 5\' 3"  (1.6 m)    Wt 161 lb 12.8 oz (73.4 kg)    SpO2 98%    BMI 28.66 kg/m    Review of Systems     Objective:   Physical Exam    Lab Results  Component Value Date   HGBA1C 8.6 (A) 08/25/2021  Assessment & Plan:  Insulin-requiring type 2 DM: uncontrolled.  N/V, due to Ozempic: I advised pt to d/c it.  Patient Instructions  check your blood sugar twice a day.  vary the time of day when you check, between before the 3 meals, and at bedtime.  also check if you have symptoms of your blood sugar being too high or too low.  please keep a record of the readings and bring it to your next appointment here (or you can bring the meter itself).  You can write it on any piece of paper.  please call us sooner if your blood sugar goes below 70, or if you have a lot of readings over 200.    Please increase the Levemir to 70 units each morning, and continue the same metformin Please come back for a follow-up appointment in 2-3 months.

## 2021-08-25 NOTE — Patient Instructions (Addendum)
check your blood sugar twice a day.  vary the time of day when you check, between before the 3 meals, and at bedtime.  also check if you have symptoms of your blood sugar being too high or too low.  please keep a record of the readings and bring it to your next appointment here (or you can bring the meter itself).  You can write it on any piece of paper.  please call us sooner if your blood sugar goes below 70, or if you have a lot of readings over 200.    Please increase the Levemir to 70 units each morning, and continue the same metformin Please come back for a follow-up appointment in 2-3 months.

## 2021-09-11 ENCOUNTER — Other Ambulatory Visit: Payer: Self-pay | Admitting: Endocrinology

## 2021-10-28 ENCOUNTER — Ambulatory Visit: Payer: Medicare Other | Admitting: Endocrinology

## 2021-10-28 DIAGNOSIS — E7800 Pure hypercholesterolemia, unspecified: Secondary | ICD-10-CM | POA: Diagnosis not present

## 2021-10-28 DIAGNOSIS — K219 Gastro-esophageal reflux disease without esophagitis: Secondary | ICD-10-CM | POA: Diagnosis not present

## 2021-10-28 DIAGNOSIS — Z23 Encounter for immunization: Secondary | ICD-10-CM | POA: Diagnosis not present

## 2021-10-28 DIAGNOSIS — E1169 Type 2 diabetes mellitus with other specified complication: Secondary | ICD-10-CM | POA: Diagnosis not present

## 2021-10-28 DIAGNOSIS — G629 Polyneuropathy, unspecified: Secondary | ICD-10-CM | POA: Diagnosis not present

## 2021-10-28 DIAGNOSIS — R1310 Dysphagia, unspecified: Secondary | ICD-10-CM | POA: Diagnosis not present

## 2021-10-28 DIAGNOSIS — Z0000 Encounter for general adult medical examination without abnormal findings: Secondary | ICD-10-CM | POA: Diagnosis not present

## 2021-11-17 IMAGING — MG MM DIGITAL SCREENING BILAT W/ TOMO AND CAD
8 series · 8 of 24 positions shown · non-contrast
Comparison: Previous exam(s).

ACR Breast Density Category a: The breast tissue is almost entirely
fatty.

CLINICAL DATA: Screening.

EXAM:
DIGITAL SCREENING BILATERAL MAMMOGRAM WITH TOMOSYNTHESIS AND CAD
TECHNIQUE: Bilateral screening digital craniocaudal and mediolateral oblique
mammograms were obtained. Bilateral screening digital breast
tomosynthesis was performed. The images were evaluated with
computer-aided detection.

[R CC synth-2D]
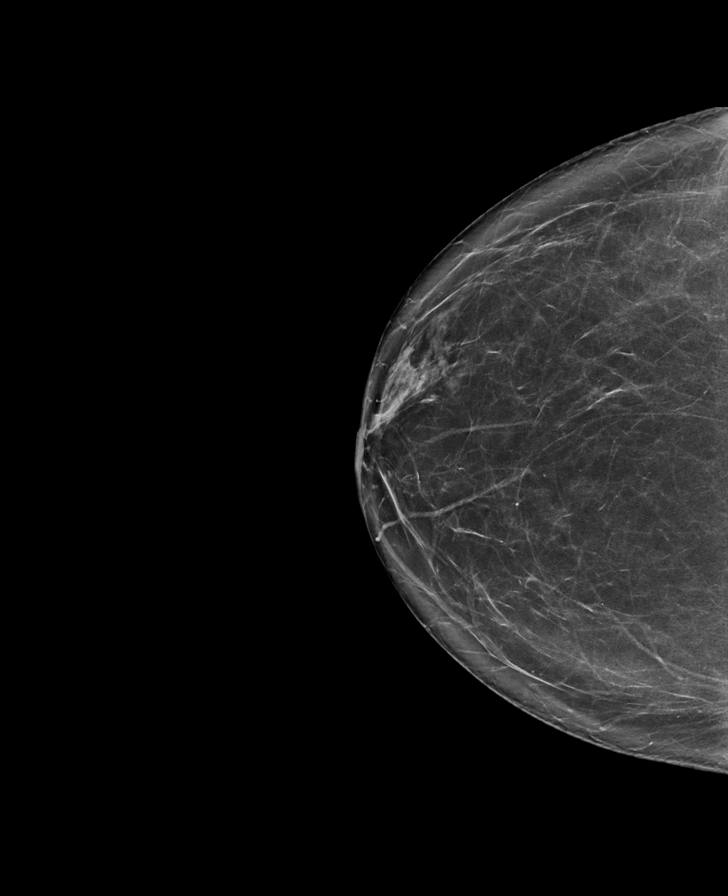

[L CC synth-2D]
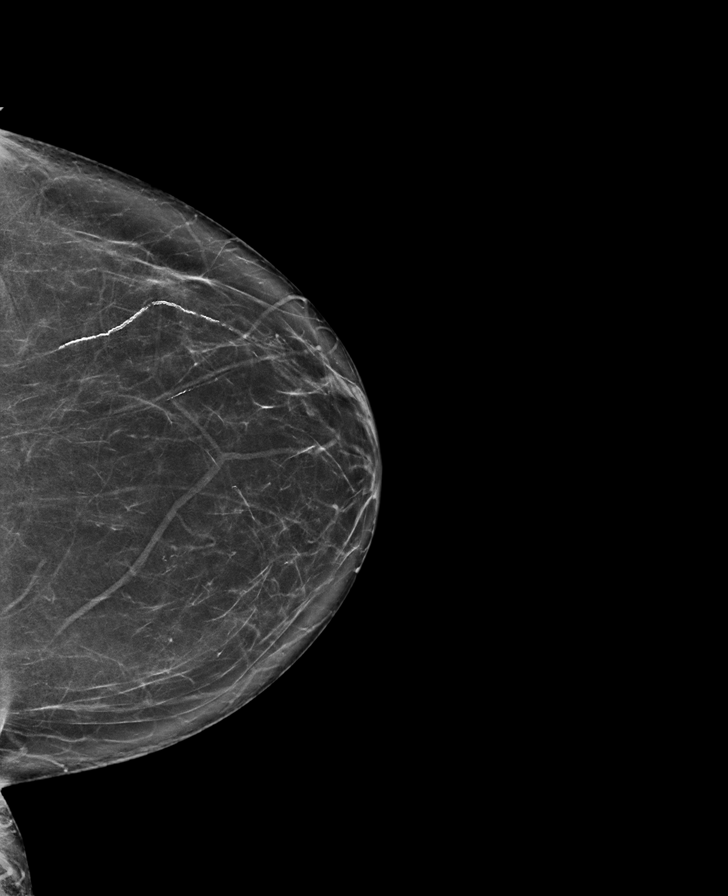

[L MLO synth-2D]
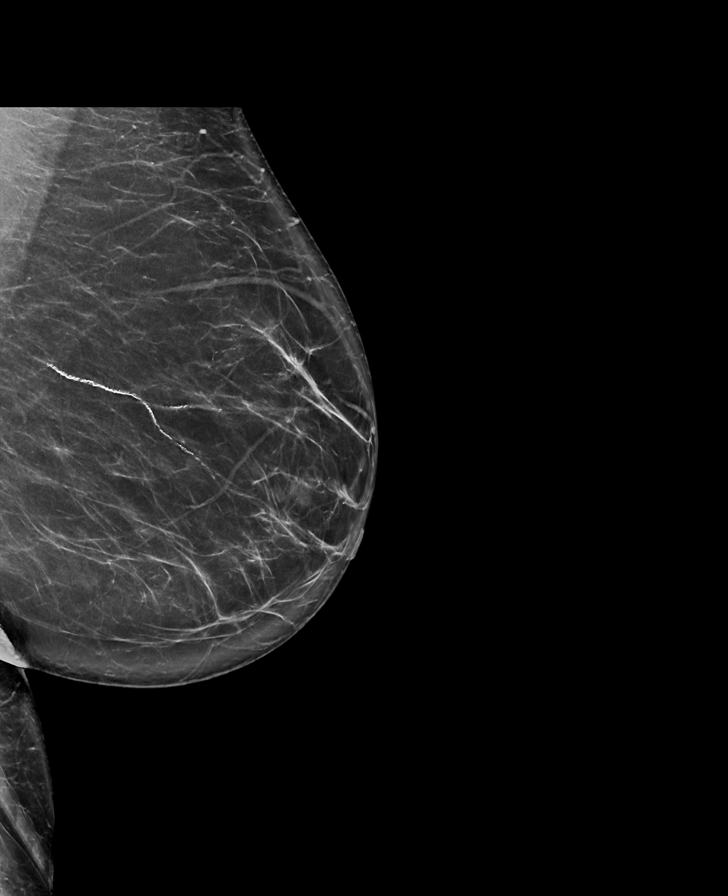

[R MLO synth-2D]
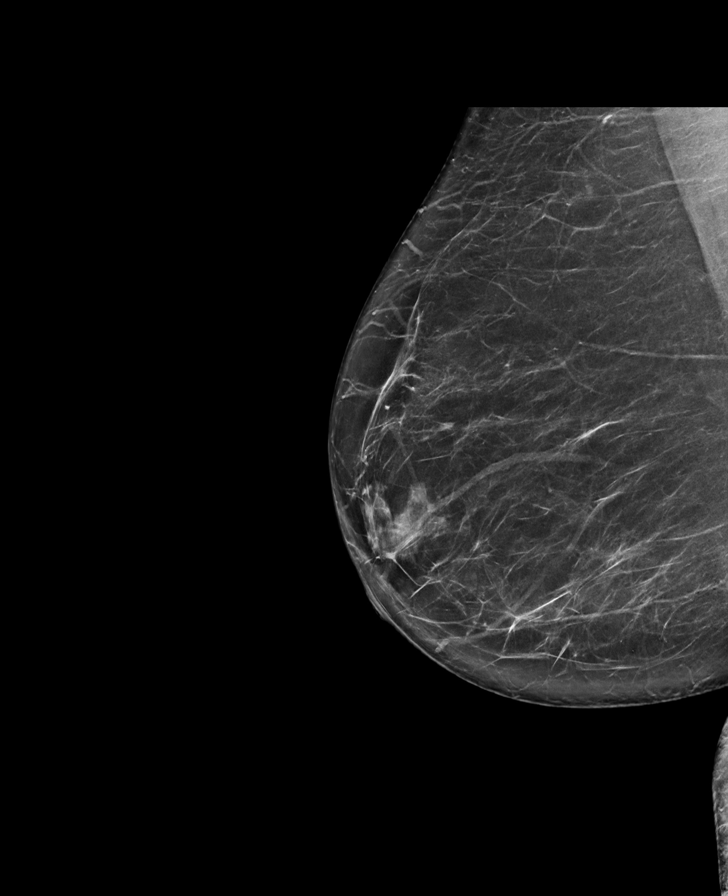

[R CC tomo · tomo slice 41/82.0]
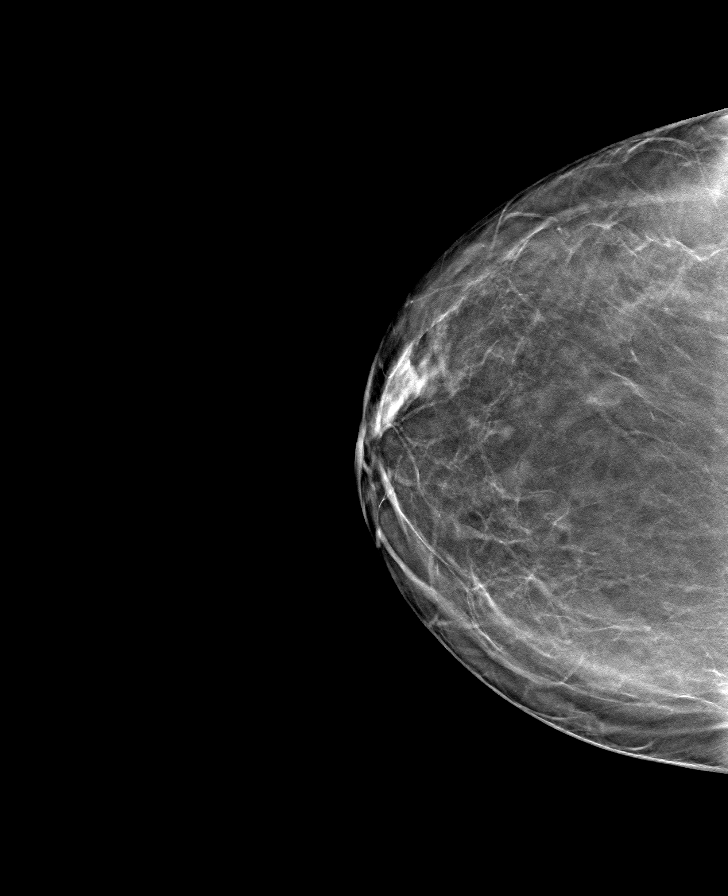

[R MLO tomo · tomo slice 43/84.0]
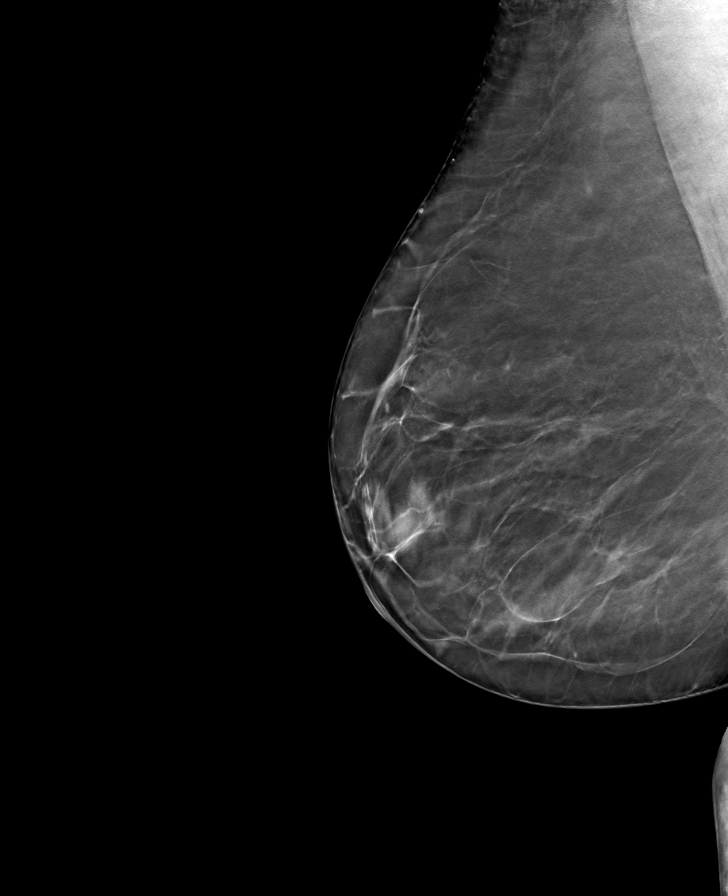

[L MLO tomo · tomo slice 43/84.0]
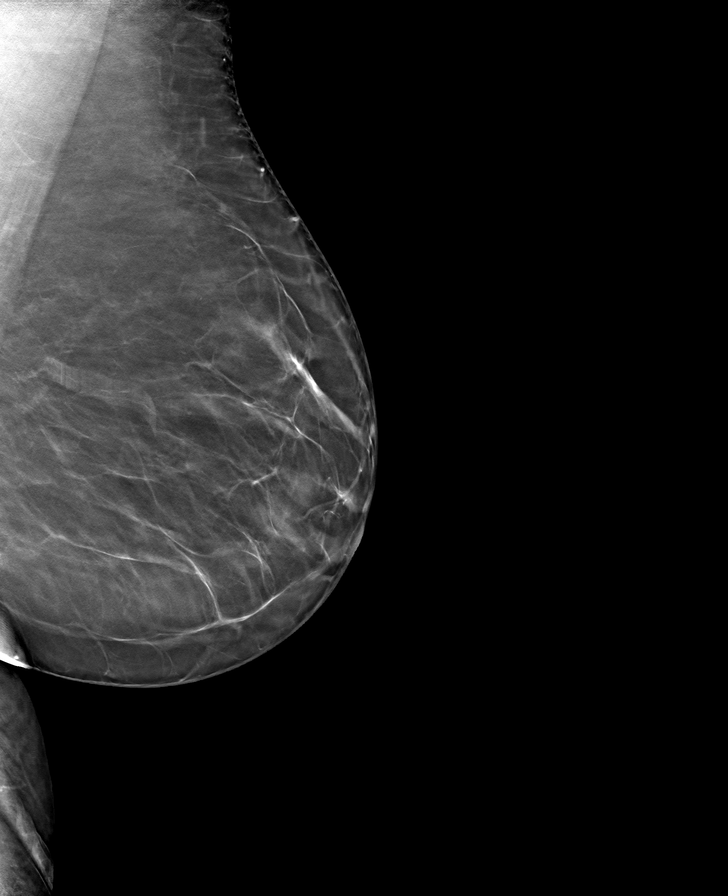

[L CC tomo · tomo slice 39/78.0]
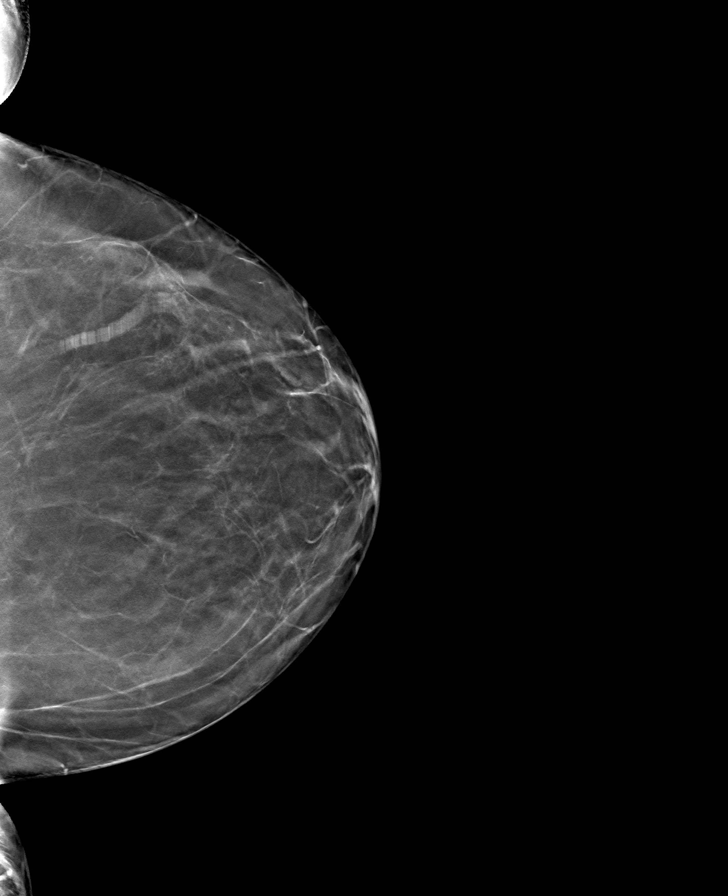

[8 of 24 positions shown; findings below may reference images not displayed]

FINDINGS: There are no findings suspicious for malignancy.
IMPRESSION: No mammographic evidence of malignancy. A result letter of this
screening mammogram will be mailed directly to the patient.

RECOMMENDATION:
Screening mammogram in one year. (Code:0E-3-N98)

BI-RADS CATEGORY  1: Negative.

## 2021-12-03 ENCOUNTER — Ambulatory Visit: Payer: Medicare Other | Admitting: Endocrinology

## 2021-12-03 ENCOUNTER — Encounter: Payer: Self-pay | Admitting: Endocrinology

## 2021-12-03 VITALS — BP 132/80 | Ht 63.0 in | Wt 164.0 lb

## 2021-12-03 DIAGNOSIS — Z794 Long term (current) use of insulin: Secondary | ICD-10-CM | POA: Diagnosis not present

## 2021-12-03 DIAGNOSIS — E119 Type 2 diabetes mellitus without complications: Secondary | ICD-10-CM

## 2021-12-03 LAB — POCT GLYCOSYLATED HEMOGLOBIN (HGB A1C): Hemoglobin A1C: 9.3 % — AB (ref 4.0–5.6)

## 2021-12-03 MED ORDER — FREESTYLE LIBRE 2 SENSOR MISC: 1.0000 | 3 refills | Status: DC

## 2021-12-03 NOTE — Progress Notes (Signed)
? ?Subjective:  ? ? Patient ID: Dorothy David, female    DOB: [birth year], 70 y.o.   MRN: [id number] ? ?HPI ?Pt returns for f/u of diabetes mellitus:  ?DM type: Insulin-requiring type 2 ?Dx'ed: 2002 ?Complications: none ?Therapy: insulin since dx, and metformin.   ?GDM: never ?DKA: never ?Severe hypoglycemia: never ?Pancreatitis: never ?Pancreatic imaging: never ?SDOH: she does not check cbg;Ins declined continuous glucose monitor.    ?Other: She did not tolerate Jardiance (vaginitis), Ozempic (N/V), or Rybelsus (nausea);  she is not a candidate for multiple daily injections, due to noncompliance.   ?Interval history:  She still often forgets the Levemir. pt states she feels well in general.    ?Past Medical History:  ?Diagnosis Date  ? Diabetes 1.5, 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[facility])   ? ? ?Past Surgical History:  ?Procedure Laterality Date  ? ABDOMINAL HYSTERECTOMY    ? TONSILLECTOMY    ? ? ?Social History  ? ?Socioeconomic History  ? Marital status: Married  ?  Spouse name: Not on file  ? Number of children: Not on file  ? Years of education: Not on file  ? Highest education level: Not on file  ?Occupational History  ? Not on file  ?Tobacco Use  ? Smoking status: Never  ? Smokeless tobacco: Never  ?Substance and Sexual Activity  ? Alcohol use: Not on file  ? Drug use: Not on file  ? Sexual activity: Not on file  ?Other Topics Concern  ? Not on file  ?Social History Narrative  ? Not on file  ? ?Social Determinants of Health  ? ?Financial Resource Strain: Not on file  ?Food Insecurity: Not on file  ?Transportation Needs: Not on file  ?Physical Activity: Not on file  ?Stress: Not on file  ?Social Connections: Not on file  ?Intimate Partner Violence: Not on file  ? ? ?Current Outpatient Medications on File Prior to Visit  ?Medication Sig Dispense Refill  ? buPROPion (WELLBUTRIN XL) 300 MG 24 hr tablet Take 300 mg by mouth daily.    ? clobetasol ointment (TEMOVATE) 0.05 % SMARTSIG:1 Topical Every Night    ? ibuprofen (ADVIL,MOTRIN)  200 MG tablet Take 200 mg by mouth every 6 (six) hours as needed (pain).    ? insulin detemir (LEVEMIR FLEXTOUCH) 100 UNIT/ML FlexPen Inject 70 Units into the skin every morning. And 31g 58mm pen needles 1/day 75 mL 3  ? metFORMIN (GLUCOPHAGE-XR) 500 MG 24 hr tablet Take 1 tablet by mouth once daily 90 tablet 0  ? omeprazole (PRILOSEC) 40 MG capsule Take 40 mg by mouth daily.    ? rosuvastatin (CRESTOR) 10 MG tablet Take 10 mg by mouth daily.    ? simvastatin (ZOCOR) 40 MG tablet Take 40 mg by mouth daily.    ? ?No current facility-administered medications on file prior to visit.  ? ? ?Allergies  ?Allergen Reactions  ? Codeine   ?  Hot flashes  ? ? ?Family History  ?Problem Relation Age of Onset  ? Breast cancer Neg Hx   ? ? ?BP 132/80   Ht 5\' 3"  (1.6 m)   Wt 164 lb (74.4 kg)   BMI 29.05 kg/m?  ? ? ?Review of Systems ? ?   ?Objective:  ? Physical Exam ?VITAL SIGNS:  See vs page ?GENERAL: no distress ? ?Lab Results  ?Component Value Date  ? HGBA1C 9.3 (A) 12/03/2021  ? ?   ?Assessment & Plan:  ?Insulin-requiring type 2 DM: uncontrolled.  therapy limited  by noncompliance.   ? ?Patient Instructions  ?check your blood sugar twice a day.  vary the time of day when you check, between before the 3 meals, and at bedtime.  also check if you have symptoms of your blood sugar being too high or too low.  please keep a record of the readings and bring it to your next appointment here (or you can bring the meter itself).  You can write it on any piece of paper.  please call us sooner if your blood sugar goes below 70, or if you have a lot of readings over 200.   ?To help you remember the Levemir, keep the pen near something you use each morning, such as the breakfast table.   ?You should have an endocrinology follow-up appointment in 2 months.   ? ? ?

## 2021-12-03 NOTE — Patient Instructions (Addendum)
check your blood sugar twice a day.  vary the time of day when you check, between before the 3 meals, and at bedtime.  also check if you have symptoms of your blood sugar being too high or too low.  please keep a record of the readings and bring it to your next appointment here (or you can bring the meter itself).  You can write it on any piece of paper.  please call us sooner if your blood sugar goes below 70, or if you have a lot of readings over 200.   ?To help you remember the Levemir, keep the pen near something you use each morning, such as the breakfast table.   ?You should have an endocrinology follow-up appointment in 2 months.   ?

## 2021-12-11 ENCOUNTER — Other Ambulatory Visit: Payer: Self-pay | Admitting: Endocrinology

## 2021-12-13 DIAGNOSIS — R1310 Dysphagia, unspecified: Secondary | ICD-10-CM | POA: Diagnosis not present

## 2021-12-13 DIAGNOSIS — K219 Gastro-esophageal reflux disease without esophagitis: Secondary | ICD-10-CM | POA: Diagnosis not present

## 2021-12-29 DIAGNOSIS — K219 Gastro-esophageal reflux disease without esophagitis: Secondary | ICD-10-CM | POA: Diagnosis not present

## 2021-12-29 DIAGNOSIS — K319 Disease of stomach and duodenum, unspecified: Secondary | ICD-10-CM | POA: Diagnosis not present

## 2021-12-29 DIAGNOSIS — R1310 Dysphagia, unspecified: Secondary | ICD-10-CM | POA: Diagnosis not present

## 2021-12-29 DIAGNOSIS — K2100 Gastro-esophageal reflux disease with esophagitis, without bleeding: Secondary | ICD-10-CM | POA: Diagnosis not present

## 2021-12-29 DIAGNOSIS — K2289 Other specified disease of esophagus: Secondary | ICD-10-CM | POA: Diagnosis not present

## 2021-12-29 DIAGNOSIS — K2970 Gastritis, unspecified, without bleeding: Secondary | ICD-10-CM | POA: Diagnosis not present

## 2022-01-04 DIAGNOSIS — K219 Gastro-esophageal reflux disease without esophagitis: Secondary | ICD-10-CM | POA: Diagnosis not present

## 2022-01-04 DIAGNOSIS — K319 Disease of stomach and duodenum, unspecified: Secondary | ICD-10-CM | POA: Diagnosis not present

## 2022-01-04 DIAGNOSIS — K2100 Gastro-esophageal reflux disease with esophagitis, without bleeding: Secondary | ICD-10-CM | POA: Diagnosis not present

## 2022-01-24 DIAGNOSIS — H6091 Unspecified otitis externa, right ear: Secondary | ICD-10-CM | POA: Diagnosis not present

## 2022-01-24 DIAGNOSIS — H6121 Impacted cerumen, right ear: Secondary | ICD-10-CM | POA: Diagnosis not present

## 2022-02-04 ENCOUNTER — Other Ambulatory Visit (INDEPENDENT_AMBULATORY_CARE_PROVIDER_SITE_OTHER): Payer: Medicare Other

## 2022-02-04 ENCOUNTER — Other Ambulatory Visit: Payer: Self-pay | Admitting: Endocrinology

## 2022-02-04 DIAGNOSIS — E119 Type 2 diabetes mellitus without complications: Secondary | ICD-10-CM | POA: Diagnosis not present

## 2022-02-04 DIAGNOSIS — Z794 Long term (current) use of insulin: Secondary | ICD-10-CM | POA: Diagnosis not present

## 2022-02-04 LAB — GLUCOSE, RANDOM: Glucose, Bld: 239 mg/dL — ABNORMAL HIGH (ref 70–99)

## 2022-02-05 LAB — FRUCTOSAMINE: Fructosamine: 357 umol/L — ABNORMAL HIGH (ref 0–285)

## 2022-02-10 ENCOUNTER — Ambulatory Visit: Payer: Medicare Other | Admitting: Endocrinology

## 2022-02-10 NOTE — Progress Notes (Deleted)
Patient ID: Dorothy David, female   DOB: 11-08-1950, 71 y.o.   MRN: [id number]           Reason for Appointment: Type II Diabetes follow-up   History of Present Illness   Diagnosis date:   Previous history:  Non-insulin hypoglycemic drugs previously used: Insulin was started in  A1c range in the last few years is:  Recent history:     Non-insulin hypoglycemic drugs:      Insulin regimen:            Side effects from medications: None  Current self management, blood sugar patterns and problems identified:  A1c is 7.9-9.3  Exercise: Diet management:     Hypoglycemia:  none    Glucometer: One Touch.           Blood Glucose readings from meter download:   PRE-MEAL Fasting Lunch Dinner Bedtime Overall  Glucose range:       Mean/median:        POST-MEAL PC Breakfast PC Lunch PC Dinner  Glucose range:     Mean/median:       Dietician visit: Most recent:      Weight control:  Wt Readings from Last 3 Encounters:  12/03/21 164 lb (74.4 kg)  08/25/21 161 lb 12.8 oz (73.4 kg)  05/25/21 173 lb (78.5 kg)            Diabetes labs:  Lab Results  Component Value Date   HGBA1C 9.3 (A) 12/03/2021   HGBA1C 8.6 (A) 08/25/2021   HGBA1C 8.6 (A) 05/25/2021   Lab Results  Component Value Date   CREATININE 0.83 05/29/2014    Lab Results  Component Value Date   FRUCTOSAMINE 357 (H) 02/04/2022     Allergies as of 02/10/2022       Reactions   Codeine    Hot flashes        Medication List        Accurate as of February 10, 2022 11:52 AM. If you have any questions, ask your nurse or doctor.          buPROPion 300 MG 24 hr tablet Commonly known as: WELLBUTRIN XL Take 300 mg by mouth daily.   clobetasol ointment 0.05 % Commonly known as: TEMOVATE SMARTSIG:1 Topical Every Night   FreeStyle Libre 2 Sensor Misc 1 Device by Does not apply route every 14 (fourteen) days.   ibuprofen 200 MG tablet Commonly known as: ADVIL Take 200 mg by mouth every 6  (six) hours as needed (pain).   Levemir FlexTouch 100 UNIT/ML FlexPen Generic drug: insulin detemir Inject 70 Units into the skin every morning. And 31g 88mm pen needles 1/day   metFORMIN 500 MG 24 hr tablet Commonly known as: GLUCOPHAGE-XR Take 1 tablet by mouth once daily   omeprazole 40 MG capsule Commonly known as: PRILOSEC Take 40 mg by mouth daily.   rosuvastatin 10 MG tablet Commonly known as: CRESTOR Take 10 mg by mouth daily.   simvastatin 40 MG tablet Commonly known as: ZOCOR Take 40 mg by mouth daily.        Allergies:  Allergies  Allergen Reactions   Codeine     Hot flashes    Past Medical History:  Diagnosis Date   Diabetes 1.5, 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HCC)     Past Surgical History:  Procedure Laterality Date   ABDOMINAL HYSTERECTOMY     TONSILLECTOMY      Family History  Problem Relation Age of Onset  Breast cancer Neg Hx     Social History:  reports that she has never smoked. She has never used smokeless tobacco. No history on file for alcohol use and drug use.  Review of Systems:  Last diabetic eye exam date  Last foot exam date:  Symptoms of neuropathy:  Hypertension:   Treatment includes  BP Readings from Last 3 Encounters:  12/03/21 132/80  08/25/21 (!) 148/90  05/25/21 130/82    Lipid management:    No results found for: "CHOL" No results found for: "HDL" No results found for: "LDLCALC" No results found for: "TRIG" No results found for: "CHOLHDL" No results found for: "LDLDIRECT"   Examination:   There were no vitals taken for this visit.  There is no height or weight on file to calculate BMI.    ASSESSMENT/ PLAN:    Diabetes type 2:   Current regimen:  See history of present illness for detailed discussion of current diabetes management, blood sugar patterns and problems identified  A1c is  Blood glucose control  Recommendations:    There are no Patient Instructions on file for this visit.   Reather Littler 02/10/2022, 11:52 AM

## 2022-02-15 ENCOUNTER — Ambulatory Visit: Payer: Medicare Other | Admitting: Internal Medicine

## 2022-02-15 NOTE — Progress Notes (Deleted)
Patient ID: Dorothy David, female   DOB: 1950-09-12, 71 y.o.   MRN: [id number]  HPI: Dorothy David is a 71 y.o.-year-old female, returning for follow-up for LADA, dx in 2002, insulin-dependent since diagnosis, uncontrolled, with complications (peripheral neuropathy). Pt. previously saw Dr. Everardo All, last visit 2.5 months ago.  Reviewed HbA1c: 02/04/2022: HbA1c calculated from fructosamine is 7.7% Lab Results  Component Value Date   HGBA1C 9.3 (A) 12/03/2021   HGBA1C 8.6 (A) 08/25/2021   HGBA1C 8.6 (A) 05/25/2021   HGBA1C 7.9 (A) 02/19/2021   HGBA1C 8.0 (A) 12/08/2020   HGBA1C 8.0 (A) 10/07/2020   Pt is on a regimen of: - Metformin ER 500 mg once a day - Levemir 70 units in a.m. -forgets doses She tried Jardiance >> vaginitis. She tried Ozempic >> N/V. She tried Rybelsus >> Nausea.  Pt checks her sugars *** a day and they are: (Declined a CGM) - am: n/c - 2h after b'fast: n/c - before lunch: n/c - 2h after lunch: n/c - before dinner: n/c - 2h after dinner: n/c - bedtime: n/c - nighttime: n/c Lowest sugar was ***; she has hypoglycemia awareness at 70.  Highest sugar was ***.  Glucometer:***  Pt's meals are: - Breakfast: - Lunch: - Dinner: - Snacks:  - no CKD, last BUN/creatinine:  Comp Metabolic Panel   4098-11-91    Albumin 4.1   3.4-4.8  ALP 63   38-126  ALT 16   0-52  Anion Gap 12.9   6.0-20.0  AST 24   0-39  BUN 17   6-26  CO2 29   22-32  CA-corrected 8.81   8.60-10.30  Calcium 8.9   8.6-10.3  Chloride 103   98-107  Creatinine 0.73   0.60-1.30  eGFR2021 87   >60  Glucose 200   70-99  Potassium 4.2   3.5-5.5  Sodium 140   136-145  Protein, Total 6.8   6.0-8.3  TBIL 0.5   0.3-1.0   Microalbumin Panel   2021-10-28    MA/CR ratio 20.2   0.0-30.0  UCR 105      UMA 2.13   0.00-1.90   Lab Results  Component Value Date   BUN 15 05/29/2014   CREATININE 0.83 05/29/2014   She is not on ACE inhibitor/ARB.  -+ HL; last set of lipids: Lipid Panel w/reflex    2021-10-28    LDL Chol Calc (NIH) 70   0-99  CHOL/HDL 2.6   2.0-4.0  Cholesterol 156   <200  HDLD 60   30-85  LDL Chol Calc (NIH) 70   0-99  NHDL 96   0-129  Triglyceride 153   0-199   No results found for: "CHOL", "HDL", "LDLCALC", "LDLDIRECT", "TRIG", "CHOLHDL" On Crestor 10 mg daily.  Zocor and Lipitor caused body aches.  - last eye exam was on 05/02/2021: No DR.   - + numbness and tingling in her feet.  Last foot exam 05/2021.  She also is a history of GERD, gastritis, dysphagia, depression.  ROS: + see HPI No increased urination, blurry vision, nausea, chest pain.  Past Medical History:  Diagnosis Date   Diabetes 1.5, 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HCC)    Past Surgical History:  Procedure Laterality Date   ABDOMINAL HYSTERECTOMY     TONSILLECTOMY     Social History   Socioeconomic History   Marital status: Married    Spouse name: Not on file   Number of children: Not on file   Years of education:  Not on file   Highest education level: Not on file  Occupational History   Not on file  Tobacco Use   Smoking status: Never   Smokeless tobacco: Never  Substance and Sexual Activity   Alcohol use: Not on file   Drug use: Not on file   Sexual activity: Not on file  Other Topics Concern   Not on file  Social History Narrative   Not on file   Social Determinants of Health   Financial Resource Strain: Not on file  Food Insecurity: Not on file  Transportation Needs: Not on file  Physical Activity: Not on file  Stress: Not on file  Social Connections: Not on file  Intimate Partner Violence: Not on file   Current Outpatient Medications on File Prior to Visit  Medication Sig Dispense Refill   buPROPion (WELLBUTRIN XL) 300 MG 24 hr tablet Take 300 mg by mouth daily.     clobetasol ointment (TEMOVATE) 0.05 % SMARTSIG:1 Topical Every Night     Continuous Blood Gluc Sensor (FREESTYLE LIBRE 2 SENSOR) MISC 1 Device by Does not apply route every 14 (fourteen) days. 6 each 3    ibuprofen (ADVIL,MOTRIN) 200 MG tablet Take 200 mg by mouth every 6 (six) hours as needed (pain).     insulin detemir (LEVEMIR FLEXTOUCH) 100 UNIT/ML FlexPen Inject 70 Units into the skin every morning. And 31g 14mm pen needles 1/day 75 mL 3   metFORMIN (GLUCOPHAGE-XR) 500 MG 24 hr tablet Take 1 tablet by mouth once daily 90 tablet 0   omeprazole (PRILOSEC) 40 MG capsule Take 40 mg by mouth daily.     rosuvastatin (CRESTOR) 10 MG tablet Take 10 mg by mouth daily.     simvastatin (ZOCOR) 40 MG tablet Take 40 mg by mouth daily.     No current facility-administered medications on file prior to visit.   Allergies  Allergen Reactions   Codeine     Hot flashes   Family History  Problem Relation Age of Onset   Breast cancer Neg Hx    PE: There were no vitals taken for this visit. Wt Readings from Last 3 Encounters:  12/03/21 164 lb (74.4 kg)  08/25/21 161 lb 12.8 oz (73.4 kg)  05/25/21 173 lb (78.5 kg)   Constitutional: overweight, in NAD Eyes: PERRLA, EOMI, no exophthalmos ENT: moist mucous membranes, no thyromegaly, no cervical lymphadenopathy Cardiovascular: RRR, No MRG Respiratory: CTA B Musculoskeletal: no deformities, strength intact in all 4 Skin: moist, warm, no rashes Neurological: no tremor with outstretched hands, DTR normal in all 4  ASSESSMENT: 1. LADA, insulin-dependent, uncontrolled, with complications - PN  2. HL  PLAN:  1. Patient with long-standing, uncontrolled diabetes, on oral antidiabetic regimen, with still poor control. - I suggested to:  There are no Patient Instructions on file for this visit. - check sugars at different times of the day - check 1x a day, rotating checks - discussed about CBG targets for treatment: 80-130 mg/dL before meals and <741 mg/dL after meals; target OIN8M <7%. - given sugar log and advised how to fill it and to bring it at next appt  - given foot care handout  - given instructions for hypoglycemia management "15-15 rule"  -  advised for yearly eye exams  - Return to clinic in 3 mo with sugar log   2. HL -Reviewed latest lipid panel from: 09/2021: Fractions at goal including LDL cholesterol -She continues on Crestor 10 mg daily -Zocor and Lipitor caused body aches.  Philemon Kingdom, MD PhD [facility]

## 2022-02-21 DIAGNOSIS — L900 Lichen sclerosus et atrophicus: Secondary | ICD-10-CM | POA: Diagnosis not present

## 2022-03-05 ENCOUNTER — Other Ambulatory Visit: Payer: Self-pay | Admitting: Internal Medicine

## 2022-04-06 ENCOUNTER — Ambulatory Visit: Payer: Self-pay | Admitting: Licensed Clinical Social Worker

## 2022-04-06 NOTE — Patient Instructions (Signed)
Visit Information  Thank you for taking time to visit with me today. Please don't hesitate to contact me if I can be of assistance to you.   Following are the goals we discussed today:   Goals Addressed             This Visit's Progress    COMPLETED: Care Coordination Activities No Follow up Required       Care Coordination Interventions: Provided education to patient re: Care Coordination Services : Declined Assessed social determinant of health barriers         Please call the care guide team at [phone] if you need to cancel or reschedule your appointment.   The patient verbalized understanding of instructions, educational materials, and care plan provided today and DECLINED offer to receive copy of patient instructions, educational materials, and care plan.   No further follow up required: by Care Coordination, no needs identified  Sammuel Hines, [facility] Care Coordination  [employer] [phone]

## 2022-04-06 NOTE — Patient Outreach (Signed)
  Care Coordination   Initial Visit Note   04/06/2022 Name: Dorothy David MRN: [id number] DOB: 01-14-1951  Dorothy David is a 71 y.o. year old female who sees [facility], [place], [place] for primary care. I spoke with  Dorothy David by phone today.  What matters to the patients health and wellness today?    Patient reports no concerns or needs from Care Coordination team with health and wellness related to physical or mental heath. .     Goals Addressed             This Visit's Progress    COMPLETED: Care Coordination Activities No Follow up Required       Care Coordination Interventions: Provided education to patient re: Care Coordination Services : Declined Assessed social determinant of health barriers         SDOH assessments and interventions completed:  Yes  SDOH Interventions Today    Flowsheet Row Most Recent Value  SDOH Interventions   Food Insecurity Interventions Intervention Not Indicated  Housing Interventions Intervention Not Indicated  Transportation Interventions Intervention Not Indicated  Utilities Interventions Intervention Not Indicated  Financial Strain Interventions Intervention Not Indicated       Care Coordination Interventions Activated:  Yes  Care Coordination Interventions:  Yes, provided   Follow up plan: No further intervention required.   Encounter Outcome:  Pt. Visit Completed   Sammuel Hines, [facility] Care Coordination  [facility] [phone]

## 2022-05-20 ENCOUNTER — Encounter: Payer: Self-pay | Admitting: Internal Medicine

## 2022-05-20 ENCOUNTER — Ambulatory Visit: Payer: Medicare Other | Admitting: Internal Medicine

## 2022-05-20 VITALS — BP 128/80 | HR 79 | Ht 63.0 in | Wt 165.4 lb

## 2022-05-20 DIAGNOSIS — E785 Hyperlipidemia, unspecified: Secondary | ICD-10-CM | POA: Diagnosis not present

## 2022-05-20 DIAGNOSIS — Z794 Long term (current) use of insulin: Secondary | ICD-10-CM | POA: Diagnosis not present

## 2022-05-20 DIAGNOSIS — E1165 Type 2 diabetes mellitus with hyperglycemia: Secondary | ICD-10-CM

## 2022-05-20 LAB — POCT GLYCOSYLATED HEMOGLOBIN (HGB A1C): Hemoglobin A1C: 9.8 % — AB (ref 4.0–5.6)

## 2022-05-20 MED ORDER — TRULICITY 0.75 MG/0.5ML ~~LOC~~ SOAJ: SUBCUTANEOUS | 5 refills | Status: DC

## 2022-05-20 NOTE — Progress Notes (Signed)
Patient ID: Dorothy David, female   DOB: 1950/10/29, 71 y.o.   MRN: [id number]  HPI: Dorothy David is a 71 y.o.-year-old female, returning for follow-up for DM2, dx in 2002, insulin-dependent since dx., uncontrolled, without long-term complications. Pt. previously saw Dr. Everardo All, last visit 5.5 months ago.  Reviewed HbA1c: 02/04/2022: HbA1c calculated from fructosamine 7.68%. Lab Results  Component Value Date   HGBA1C 9.3 (A) 12/03/2021   HGBA1C 8.6 (A) 08/25/2021   HGBA1C 8.6 (A) 05/25/2021   HGBA1C 7.9 (A) 02/19/2021   HGBA1C 8.0 (A) 12/08/2020   HGBA1C 8.0 (A) 10/07/2020   Pt is on a regimen of: - Metformin ER 500 mg daily >> off now for 2 mo after she ran out and could not refill it - had signif. Diarrhea with higher doses - Tradjenta 5 mg before b'fast - Levemir 70 >> 78 units in am  - frequently forgets  She had vaginitis from [place]. She had nausea from Rybelsus. She had nausea and vomiting from Ozempic. She was started in Trulicity in the past >> tolerated well.  Pt is checking blood sugars with her Freestyle Libre CGM (>4x a day):   Lowest sugar was 60s; she has hypoglycemia awareness at 70.  Highest sugar was 300s.  - no CKD, last BUN/creatinine:     Lab Results  Component Value Date   BUN 15 05/29/2014   CREATININE 0.83 05/29/2014  She is not on an ACE inhibitor/ARB.  - + HL; last set of lipids:  No results found for: "CHOL", "HDL", "LDLCALC", "LDLDIRECT", "TRIG", "CHOLHDL" On Crestor 10 mg daily.  - last eye exam was in 04/2021. No DR reportedly.  - no numbness and tingling in her feet.  Last foot exam 05/25/2021.  She has a history of lichen sclerosus.  ROS: + see HPI No increased urination, blurry vision, nausea, chest pain.  Past Medical History:  Diagnosis Date   Diabetes 1.5, 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HCC)    Past Surgical History:  Procedure Laterality Date   ABDOMINAL HYSTERECTOMY     TONSILLECTOMY     Social History   Socioeconomic History    Marital status: Married    Spouse name: Not on file   Number of children: Not on file   Years of education: Not on file   Highest education level: Not on file  Occupational History   Not on file  Tobacco Use   Smoking status: Never   Smokeless tobacco: Never  Substance and Sexual Activity   Alcohol use: Not on file   Drug use: Not on file   Sexual activity: Not on file  Other Topics Concern   Not on file  Social History Narrative   Not on file   Social Determinants of Health   Financial Resource Strain: Low Risk  (04/06/2022)   Overall Financial Resource Strain (CARDIA)    Difficulty of Paying Living Expenses: Not hard at all  Food Insecurity: No Food Insecurity (04/06/2022)   Hunger Vital Sign    Worried About Running Out of Food in the Last Year: Never true    Ran Out of Food in the Last Year: Never true  Transportation Needs: No Transportation Needs (04/06/2022)   PRAPARE - Administrator, Civil Service (Medical): No    Lack of Transportation (Non-Medical): No  Physical Activity: Not on file  Stress: Not on file  Social Connections: Not on file  Intimate Partner Violence: Not on file   Current Outpatient Medications on  File Prior to Visit  Medication Sig Dispense Refill   buPROPion (WELLBUTRIN XL) 300 MG 24 hr tablet Take 300 mg by mouth daily.     clobetasol ointment (TEMOVATE) 0.05 % SMARTSIG:1 Topical Every Night     Continuous Blood Gluc Sensor (FREESTYLE LIBRE 2 SENSOR) MISC 1 Device by Does not apply route every 14 (fourteen) days. 6 each 3   ibuprofen (ADVIL,MOTRIN) 200 MG tablet Take 200 mg by mouth every 6 (six) hours as needed (pain).     insulin detemir (LEVEMIR FLEXTOUCH) 100 UNIT/ML FlexPen Inject 70 Units into the skin every morning. And 31g 62mm pen needles 1/day 75 mL 3   metFORMIN (GLUCOPHAGE-XR) 500 MG 24 hr tablet TAKE 1 TABLET BY MOUTH ONCE DAILY. CONTACT OFFICE TO SCHEDULE WITH NEW PROVIDER 90 tablet 0   omeprazole (PRILOSEC) 40 MG capsule  Take 40 mg by mouth daily.     rosuvastatin (CRESTOR) 10 MG tablet Take 10 mg by mouth daily.     simvastatin (ZOCOR) 40 MG tablet Take 40 mg by mouth daily.     No current facility-administered medications on file prior to visit.   Allergies  Allergen Reactions   Codeine     Hot flashes   Family History  Problem Relation Age of Onset   Breast cancer Neg Hx    PE: BP 128/80 (BP Location: Right Arm, Patient Position: Sitting, Cuff Size: Normal)   Pulse 79   Ht 5\' 3"  (1.6 m)   Wt 165 lb 6.4 oz (75 kg)   SpO2 99%   BMI 29.30 kg/m  Wt Readings from Last 3 Encounters:  05/20/22 165 lb 6.4 oz (75 kg)  12/03/21 164 lb (74.4 kg)  08/25/21 161 lb 12.8 oz (73.4 kg)   Constitutional: Slightly overweight, in NAD Eyes: no exophthalmos ENT: moist mucous membranes, no thyromegaly, no cervical lymphadenopathy Cardiovascular: RRR, No MRG Respiratory: CTA B Musculoskeletal: no deformities Skin: moist, warm, no rashes Neurological: no tremor with outstretched hands Diabetic Foot Exam - Simple   Simple Foot Form Diabetic Foot exam was performed with the following findings: Yes 05/20/2022  3:50 PM  Visual Inspection No deformities, no ulcerations, no other skin breakdown bilaterally: Yes Sensation Testing Intact to touch and monofilament testing bilaterally: Yes Pulse Check Posterior Tibialis and Dorsalis pulse intact bilaterally: Yes Comments    ASSESSMENT: 1. DM2, insulin-dependent, uncontrolled, without long-term complications, but with hyperglycemia  2. HL  PLAN:  1. Patient with long-standing, uncontrolled diabetes, on oral antidiabetic regimen, with still poor control.  Latest HbA1c was 9.3%, higher, at last visit with Dr. Loanne Drilling.  In 01/2022, and HbA1c calculated from fructosamine was lower, though, at 7.7%.  At today's visit, HbA1c is even higher than at last visit, at 9.8%. CGM interpretation: -At today's visit, we reviewed her CGM downloads: It appears that 70% of values  are in target range (goal >70%), while 1% are higher than 180 (goal <25%), and 2% are lower than 70 (goal <4%).  The calculated average blood sugar is 225.  -Reviewing the CGM trends, we have too many gaps in the data to understand trends, but reviewing individual daily traces, sugars are increasing significantly, particularly after meals.  We discussed that we need better mealtime coverage.  She is currently only on basal insulin, high-dose, and she was on metformin but she cannot tolerate this well due to diarrhea even at the lowest dose, at 500 mg daily.  She was also on Tradjenta, but ran out.  We discussed that  this is not strong enough to help her at this point.  Options for treatment are mealtime insulin or retrying a GLP-1 receptor agonist.  She could not tolerate Rybelsus and Ozempic, but she does remember that she has been on Trulicity in the past and she tolerated it well.  She would be open to trying this again.  I advised her to start at a low dose and let me know how she tolerated so I can send a higher dose if she does well with it.  Also, I advised her that if the sugars remain high after starting Trulicity, to let me know.  If she cannot afford Trulicity or he cannot tolerate it, we will need mealtime insulin.  We discussed how the regimen would work.  She agrees with the plan. -We also discussed about reducing sweets - I suggested to:  Patient Instructions  Please start: - Trulicity 0.75 mg weekly Let me know if the sugars  do not improve after this.  Continue: - Levemir 78 units in am  Please return for another visit in 3-4 months.  - check sugars at different times of the day - check 2x a day, rotating checks - discussed about CBG targets for treatment: 80-130 mg/dL before meals and <096 mg/dL after meals; target [id number] <7%. - given foot care handout  - given instructions for hypoglycemia management "15-15 rule"  - advised for yearly eye exams  - Return to clinic in 3-4 mo  2.  HL - Reviewed latest lipid panel from 09/2021: Fractions at goal No results found for: "CHOL", "HDL", "LDLCALC", "LDLDIRECT", "TRIG", "CHOLHDL" - Continues Crestor 10 mg daily without side effects.   Dorothy Pavlov, MD PhD [facility] Endocrinology

## 2022-05-20 NOTE — Patient Instructions (Addendum)
Please start: - Trulicity 5.00 mg weekly Let me know if the sugars  do not improve after this.  Continue: - Levemir 78 units in am  Please return for another visit in 3-4 months.  PATIENT INSTRUCTIONS FOR TYPE 2 DIABETES:  **Please join MyChart!** - see attached instructions about how to join if you have not done so already.  DIET AND EXERCISE Diet and exercise is an important part of diabetic treatment.  We recommended aerobic exercise in the form of brisk walking (working between 40-60% of maximal aerobic capacity, similar to brisk walking) for 150 minutes per week (such as 30 minutes five days per week) along with 3 times per week performing 'resistance' training (using various gauge rubber tubes with handles) 5-10 exercises involving the major muscle groups (upper body, lower body and core) performing 10-15 repetitions (or near fatigue) each exercise. Start at half the above goal but build slowly to reach the above goals. If limited by weight, joint pain, or disability, we recommend daily walking in a swimming pool with water up to waist to reduce pressure from joints while allow for adequate exercise.    BLOOD GLUCOSES Monitoring your blood glucoses is important for continued management of your diabetes. Please check your blood glucoses 2-4 times a day: fasting, before meals and at bedtime (you can rotate these measurements - e.g. one day check before the 3 meals, the next day check before 2 of the meals and before bedtime, etc.).   HYPOGLYCEMIA (low blood sugar) Hypoglycemia is usually a reaction to not eating, exercising, or taking too much insulin/ other diabetes drugs.  Symptoms include tremors, sweating, hunger, confusion, headache, etc. Treat IMMEDIATELY with 15 grams of Carbs: 4 glucose tablets  cup regular juice/soda 2 tablespoons raisins 4 teaspoons sugar 1 tablespoon honey Recheck blood glucose in 15 mins and repeat above if still symptomatic/blood glucose  <100.  RECOMMENDATIONS TO REDUCE YOUR RISK OF DIABETIC COMPLICATIONS: * Take your prescribed MEDICATION(S) * Follow a DIABETIC diet: Complex carbs, fiber rich foods, (monounsaturated and polyunsaturated) fats * AVOID saturated/trans fats, high fat foods, >2,300 mg salt per day. * EXERCISE at least 5 times a week for 30 minutes or preferably daily.  * DO NOT SMOKE OR DRINK more than 1 drink a day. * Check your FEET every day. Do not wear tightfitting shoes. Contact us if you develop an ulcer * See your EYE doctor once a year or more if needed * Get a FLU shot once a year * Get a PNEUMONIA vaccine once before and once after age 58 years  GOALS:  * Your Hemoglobin A1c of <7%  * fasting sugars need to be 80-130 * after meals sugars need to be <180 (2h after you start eating) * Your Systolic BP should be 938 or lower  * Your Diastolic BP should be 80 or lower  * Your HDL (Good Cholesterol) should be 40 or higher  * Your LDL (Bad Cholesterol) should be ideally <70. * Your Triglycerides should be 150 or lower  * Your Urine microalbumin (kidney function) should be <30 * Your Body Mass Index should be 25 or lower   Please consider the following ways to cut down carbs and fat and increase fiber and micronutrients in your diet: - substitute whole grain for white bread or pasta - substitute brown rice for white rice - substitute 90-calorie flat bread pieces for slices of bread when possible - substitute sweet potatoes or yams for white potatoes - substitute humus for  margarine - substitute tofu for cheese when possible - substitute almond or rice milk for regular milk (would not drink soy milk daily due to concern for soy estrogen influence on breast cancer risk) - substitute dark chocolate for other sweets when possible - substitute water - can add lemon or orange slices for taste - for diet sodas (artificial sweeteners will trick your body that you can eat sweets without getting calories and  will lead you to overeating and weight gain in the long run) - do not skip breakfast or other meals (this will slow down the metabolism and will result in more weight gain over time)  - can try smoothies made from fruit and almond/rice milk in am instead of regular breakfast - can also try old-fashioned (not instant) oatmeal made with almond/rice milk in am - order the dressing on the side when eating salad at a restaurant (pour less than half of the dressing on the salad) - eat as little meat as possible - can try juicing, but should not forget that juicing will get rid of the fiber, so would alternate with eating raw veg./fruits or drinking smoothies - use as little oil as possible, even when using olive oil - can dress a salad with a mix of balsamic vinegar and lemon juice, for e.g. - use agave nectar, stevia sugar, or regular sugar rather than artificial sweateners - steam or broil/roast veggies  - snack on veggies/fruit/nuts (unsalted, preferably) when possible, rather than processed foods - reduce or eliminate aspartame in diet (it is in diet sodas, chewing gum, etc) Read the labels!  Try to read Dr. Katherina Right book: "Program for Reversing Diabetes" for other ideas for healthy eating.

## 2022-07-01 DIAGNOSIS — E119 Type 2 diabetes mellitus without complications: Secondary | ICD-10-CM | POA: Diagnosis not present

## 2022-08-26 DIAGNOSIS — L900 Lichen sclerosus et atrophicus: Secondary | ICD-10-CM | POA: Diagnosis not present

## 2022-09-16 ENCOUNTER — Ambulatory Visit: Payer: Medicare Other | Admitting: Internal Medicine

## 2022-09-16 ENCOUNTER — Encounter: Payer: Self-pay | Admitting: Internal Medicine

## 2022-09-16 VITALS — BP 140/90 | HR 76 | Ht 63.0 in | Wt 158.2 lb

## 2022-09-16 DIAGNOSIS — E785 Hyperlipidemia, unspecified: Secondary | ICD-10-CM

## 2022-09-16 DIAGNOSIS — Z794 Long term (current) use of insulin: Secondary | ICD-10-CM

## 2022-09-16 DIAGNOSIS — E1165 Type 2 diabetes mellitus with hyperglycemia: Secondary | ICD-10-CM | POA: Diagnosis not present

## 2022-09-16 LAB — POCT GLYCOSYLATED HEMOGLOBIN (HGB A1C): Hemoglobin A1C: 8.5 % — AB (ref 4.0–5.6)

## 2022-09-16 MED ORDER — LEVEMIR FLEXTOUCH 100 UNIT/ML ~~LOC~~ SOPN: 78.0000 [IU] | PEN_INJECTOR | SUBCUTANEOUS | 3 refills | Status: DC

## 2022-09-16 MED ORDER — TRULICITY 1.5 MG/0.5ML ~~LOC~~ SOAJ: 1.5000 mg | SUBCUTANEOUS | 3 refills | Status: DC

## 2022-09-16 NOTE — Progress Notes (Signed)
Patient ID: Dorothy David, female   DOB: 11/11/1950, 72 y.o.   MRN: [id number]  HPI: Dorothy David is a 72 y.o.-year-old female, returning for follow-up for DM2, dx in 2002, insulin-dependent since dx., uncontrolled, without long-term complications. Pt. previously saw Dr. Loanne Drilling, but last visit with me 4 months ago.  Interim history: No increased urination, blurry vision, nausea, chest pain.  Reviewed HbA1c: Lab Results  Component Value Date   HGBA1C 9.8 (A) 05/20/2022   HGBA1C 9.3 (A) 12/03/2021   HGBA1C 8.6 (A) 08/25/2021   HGBA1C 8.6 (A) 05/25/2021   HGBA1C 7.9 (A) 02/19/2021   HGBA1C 8.0 (A) 12/08/2020   HGBA1C 8.0 (A) 10/07/2020  02/04/2022: HbA1c calculated from fructosamine 7.68%.  Pt is on a regimen of: - Tradjenta 5 mg before b'fast  >> Trulicity [id number] mg weekly-started 05/2022 - Levemir 70 >> 78 units in am  - frequently forgets She had vaginitis from [name]. She had nausea from Rybelsus. She had nausea and vomiting from Ozempic. She was started in Trulicity in the past >> tolerated well. She could not tolerate metformin ER even at the lowest dose due to diarrhea.  Pt is checking blood sugars with her Freestyle Libre CGM (>4x a day):   Lowest sugar was 60s >> 60s; she has hypoglycemia awareness at 70.  Highest sugar was 300s >> 300s.  - no CKD, last BUN/creatinine:     Lab Results  Component Value Date   BUN 15 05/29/2014   CREATININE 0.83 05/29/2014  She is not on an ACE inhibitor/ARB.  - + HL; last set of lipids:  No results found for: "CHOL", "HDL", "LDLCALC", "LDLDIRECT", "TRIG", "CHOLHDL" On Crestor 10 mg daily.  - last eye exam was in 07/2022. No DR reportedly. Mild Cataract.  - no numbness and tingling in her feet.  Last foot exam 05/20/2022.  She has a history of lichen sclerosus.  ROS: + see HPI  Past Medical History:  Diagnosis Date   Diabetes 1.5, 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[place])    Past Surgical History:  Procedure Laterality Date   ABDOMINAL  HYSTERECTOMY     TONSILLECTOMY     Social History   Socioeconomic History   Marital status: Married    Spouse name: Not on file   Number of children: Not on file   Years of education: Not on file   Highest education level: Not on file  Occupational History   Not on file  Tobacco Use   Smoking status: Never   Smokeless tobacco: Never  Substance and Sexual Activity   Alcohol use: Not on file   Drug use: Not on file   Sexual activity: Not on file  Other Topics Concern   Not on file  Social History Narrative   Not on file   Social Determinants of Health   Financial Resource Strain: Low Risk  (04/06/2022)   Overall Financial Resource Strain (CARDIA)    Difficulty of Paying Living Expenses: Not hard at all  Food Insecurity: No Food Insecurity (04/06/2022)   Hunger Vital Sign    Worried About Running Out of Food in the Last Year: Never true    [place] in the Last Year: Never true  Transportation Needs: No Transportation Needs (04/06/2022)   PRAPARE - Hydrologist (Medical): No    Lack of Transportation (Non-Medical): No  Physical Activity: Not on file  Stress: Not on file  Social Connections: Not on file  Intimate Partner  Violence: Not on file   Current Outpatient Medications on File Prior to Visit  Medication Sig Dispense Refill   buPROPion (WELLBUTRIN XL) 300 MG 24 hr tablet Take 300 mg by mouth daily.     clobetasol ointment (TEMOVATE) 0.05 % SMARTSIG:1 Topical Every Night     Continuous Blood Gluc Sensor (FREESTYLE LIBRE 2 SENSOR) MISC 1 Device by Does not apply route every 14 (fourteen) days. 6 each 3   Dulaglutide (TRULICITY) A999333 0000000 SOPN Inject 0.75 mg in am weekly under skin 2 mL 5   ibuprofen (ADVIL,MOTRIN) 200 MG tablet Take 200 mg by mouth every 6 (six) hours as needed (pain).     insulin detemir (LEVEMIR FLEXTOUCH) 100 UNIT/ML FlexPen Inject 70 Units into the skin every morning. And 31g 19m pen needles 1/day 75 mL 3    metFORMIN (GLUCOPHAGE-XR) 500 MG 24 hr tablet TAKE 1 TABLET BY MOUTH ONCE DAILY. CONTACT OFFICE TO SCHEDULE WITH NEW PROVIDER 90 tablet 0   omeprazole (PRILOSEC) 40 MG capsule Take 40 mg by mouth daily.     rosuvastatin (CRESTOR) 10 MG tablet Take 10 mg by mouth daily.     simvastatin (ZOCOR) 40 MG tablet Take 40 mg by mouth daily.     No current facility-administered medications on file prior to visit.   Allergies  Allergen Reactions   Codeine     Hot flashes   Family History  Problem Relation Age of Onset   Breast cancer Neg Hx    PE: BP (!) 140/90 (BP Location: Right Arm, Patient Position: Sitting, Cuff Size: Normal)   Pulse 76   Ht 5' 3"$  (1.6 m)   Wt 158 lb 3.2 oz (71.8 kg)   SpO2 98%   BMI 28.02 kg/m  Wt Readings from Last 3 Encounters:  09/16/22 158 lb 3.2 oz (71.8 kg)  05/20/22 165 lb 6.4 oz (75 kg)  12/03/21 164 lb (74.4 kg)   Constitutional: Slightly overweight, in NAD Eyes: no exophthalmos ENT: no thyromegaly, no cervical lymphadenopathy Cardiovascular: RRR, No MRG Respiratory: CTA B Musculoskeletal: no deformities Skin:  no rashes Neurological: no tremor with outstretched hands  ASSESSMENT: 1. DM2, insulin-dependent, uncontrolled, without long-term complications, but with hyperglycemia  2. HL  PLAN:  1. Patient with longstanding, uncontrolled, type 2 diabetes, on weekly GLP-1 receptor agonist and also long-acting insulin, with still poor control.  At last visit, HbA1c was higher, at 9.8%.  Reviewing the CGM trends, we had too many gaps in the data to understand trends, but reviewing individual daily tracings, sugars were increasing significantly, particularly after meals.  She could not tolerate metformin due to diarrhea even at the lowest dose.  She also could not tolerate Rybelsus and Ozempic in the past but she remembered that she was on Trulicity with good tolerance.  I recommended to start a low-dose Trulicity.  I advised her that if she could not tolerate  this, we would need mealtime insulin.  We also discussed about improving diet by reducing sweets. CGM interpretation: -At today's visit, we reviewed her CGM downloads: It appears that 46% of values are in target range (goal >70%), while 54% are higher than 180 (goal <25%), and 0% are lower than 70 (goal <4%).  The calculated average blood sugar is 201.   -Reviewing the CGM trends, sugars appear to be quite fluctuating overnight but they increased abruptly after breakfast.  Upon questioning, she is not very compliant with her Levemir, frequently forgets doses.  I have loss of data afterwards, but  sugars are quite variable per review of the individual daily traces.  We discussed that she cannot miss long-acting insulin doses.  I suggested to set an alarm on her phone to take it in the morning.  Will also try to go ahead and increase Trulicity, since she mentions she is tolerating the lower dose well.  At next visit, we probably will need to start mealtime insulin.  She really would like to avoid this for now.  We discussed about the need to improve her diet, which is quite poor now, per her report. - I suggested to:  Patient Instructions  Please increase: - Trulicity 1.5 mg weekly   Continue: - Levemir 78 units in am - set alarm on the phone to take it.  Try to improve b'fast.  Please return for another visit in 3-4 months.  - we checked her HbA1c: 8.5% (lower) - advised to check sugars at different times of the day - 4x a day, rotating check times - advised for yearly eye exams >> she is UTD - return to clinic in 3-4 months  2. HL -Reviewed latest lipid panel from 09/2021: Fractions at goal No results found for: "CHOL", "HDL", "LDLCALC", "LDLDIRECT", "TRIG", "CHOLHDL" -She continues to Crestor 10 mg daily without side effects  Philemon Kingdom, MD PhD [facility] Endocrinology

## 2022-09-16 NOTE — Patient Instructions (Addendum)
Please increase: - Trulicity 1.5 mg weekly   Continue: - Levemir 78 units in am - set alarm on the phone to take it.  Try to improve b'fast.  Please return for another visit in 3-4 months.

## 2022-09-23 ENCOUNTER — Ambulatory Visit
Admission: RE | Admit: 2022-09-23 | Discharge: 2022-09-23 | Disposition: A | Discharge status: Home / Self Care | Payer: Medicare Other | Source: Ambulatory Visit | Attending: Physician Assistant | Admitting: Physician Assistant

## 2022-09-23 ENCOUNTER — Other Ambulatory Visit: Payer: Self-pay | Admitting: Physician Assistant

## 2022-09-23 DIAGNOSIS — M7989 Other specified soft tissue disorders: Secondary | ICD-10-CM | POA: Diagnosis not present

## 2022-09-23 DIAGNOSIS — M79641 Pain in right hand: Secondary | ICD-10-CM

## 2022-09-23 DIAGNOSIS — E119 Type 2 diabetes mellitus without complications: Secondary | ICD-10-CM | POA: Diagnosis not present

## 2022-09-23 DIAGNOSIS — T148XXA Other injury of unspecified body region, initial encounter: Secondary | ICD-10-CM | POA: Diagnosis not present

## 2022-09-24 ENCOUNTER — Encounter (HOSPITAL_BASED_OUTPATIENT_CLINIC_OR_DEPARTMENT_OTHER): Payer: Self-pay

## 2022-09-24 ENCOUNTER — Observation Stay (HOSPITAL_COMMUNITY): Payer: Medicare Other | Admitting: Anesthesiology

## 2022-09-24 ENCOUNTER — Other Ambulatory Visit: Payer: Self-pay

## 2022-09-24 ENCOUNTER — Inpatient Hospital Stay (HOSPITAL_BASED_OUTPATIENT_CLINIC_OR_DEPARTMENT_OTHER)
Admission: EM | Admit: 2022-09-24 | Discharge: 2022-09-26 | DRG: 983 | Disposition: A | Discharge status: Home / Self Care | Payer: Medicare Other | Attending: Internal Medicine | Admitting: Internal Medicine

## 2022-09-24 ENCOUNTER — Observation Stay (HOSPITAL_BASED_OUTPATIENT_CLINIC_OR_DEPARTMENT_OTHER): Payer: Medicare Other | Admitting: Anesthesiology

## 2022-09-24 ENCOUNTER — Inpatient Hospital Stay: Admit: 2022-09-24 | Payer: Medicare Other | Admitting: Orthopedic Surgery

## 2022-09-24 ENCOUNTER — Encounter (HOSPITAL_COMMUNITY)
Admission: EM | Disposition: A | Discharge status: Home / Self Care | Payer: Self-pay | Source: Home / Self Care | Attending: Internal Medicine

## 2022-09-24 DIAGNOSIS — E1369 Other specified diabetes mellitus with other specified complication: Secondary | ICD-10-CM | POA: Diagnosis not present

## 2022-09-24 DIAGNOSIS — Z90710 Acquired absence of both cervix and uterus: Secondary | ICD-10-CM

## 2022-09-24 DIAGNOSIS — Z794 Long term (current) use of insulin: Secondary | ICD-10-CM | POA: Diagnosis not present

## 2022-09-24 DIAGNOSIS — M659 Synovitis and tenosynovitis, unspecified: Secondary | ICD-10-CM | POA: Insufficient documentation

## 2022-09-24 DIAGNOSIS — E1365 Other specified diabetes mellitus with hyperglycemia: Secondary | ICD-10-CM | POA: Diagnosis present

## 2022-09-24 DIAGNOSIS — Z888 Allergy status to other drugs, medicaments and biological substances status: Secondary | ICD-10-CM

## 2022-09-24 DIAGNOSIS — E119 Type 2 diabetes mellitus without complications: Secondary | ICD-10-CM | POA: Diagnosis not present

## 2022-09-24 DIAGNOSIS — Z79899 Other long term (current) drug therapy: Secondary | ICD-10-CM | POA: Diagnosis not present

## 2022-09-24 DIAGNOSIS — E785 Hyperlipidemia, unspecified: Secondary | ICD-10-CM | POA: Insufficient documentation

## 2022-09-24 DIAGNOSIS — Z885 Allergy status to narcotic agent status: Secondary | ICD-10-CM

## 2022-09-24 DIAGNOSIS — E1165 Type 2 diabetes mellitus with hyperglycemia: Secondary | ICD-10-CM | POA: Diagnosis not present

## 2022-09-24 DIAGNOSIS — L089 Local infection of the skin and subcutaneous tissue, unspecified: Principal | ICD-10-CM | POA: Diagnosis present

## 2022-09-24 DIAGNOSIS — E782 Mixed hyperlipidemia: Secondary | ICD-10-CM

## 2022-09-24 DIAGNOSIS — E139 Other specified diabetes mellitus without complications: Secondary | ICD-10-CM | POA: Diagnosis not present

## 2022-09-24 DIAGNOSIS — M65949 Unspecified synovitis and tenosynovitis, unspecified hand: Secondary | ICD-10-CM | POA: Insufficient documentation

## 2022-09-24 DIAGNOSIS — L03011 Cellulitis of right finger: Secondary | ICD-10-CM | POA: Diagnosis not present

## 2022-09-24 DIAGNOSIS — Z7984 Long term (current) use of oral hypoglycemic drugs: Secondary | ICD-10-CM

## 2022-09-24 DIAGNOSIS — M65841 Other synovitis and tenosynovitis, right hand: Secondary | ICD-10-CM | POA: Diagnosis not present

## 2022-09-24 HISTORY — PX: I & D EXTREMITY: SHX5045

## 2022-09-24 HISTORY — DX: Local infection of the skin and subcutaneous tissue, unspecified: L08.9

## 2022-09-24 HISTORY — DX: Hyperlipidemia, unspecified: E78.5

## 2022-09-24 HISTORY — DX: Unspecified synovitis and tenosynovitis, unspecified hand: M65.949

## 2022-09-24 LAB — CBC
HCT: 41.3 % (ref 36.0–46.0)
Hemoglobin: 14.1 g/dL (ref 12.0–15.0)
MCH: 32 pg (ref 26.0–34.0)
MCHC: 34.1 g/dL (ref 30.0–36.0)
MCV: 93.9 fL (ref 80.0–100.0)
Platelets: 195 10*3/uL (ref 150–400)
RBC: 4.4 MIL/uL (ref 3.87–5.11)
RDW: 13 % (ref 11.5–15.5)
WBC: 8.3 10*3/uL (ref 4.0–10.5)
nRBC: 0 % (ref 0.0–0.2)

## 2022-09-24 LAB — BASIC METABOLIC PANEL
Anion gap: 8 (ref 5–15)
BUN: 15 mg/dL (ref 8–23)
CO2: 26 mmol/L (ref 22–32)
Calcium: 9.2 mg/dL (ref 8.9–10.3)
Chloride: 105 mmol/L (ref 98–111)
Creatinine, Ser: 0.71 mg/dL (ref 0.44–1.00)
GFR, Estimated: >60 mL/min (ref >60)
Glucose, Bld: 225 mg/dL — ABNORMAL HIGH (ref 70–99)
Potassium: 3.7 mmol/L (ref 3.5–5.1)
Sodium: 139 mmol/L (ref 135–145)

## 2022-09-24 LAB — GLUCOSE, CAPILLARY
Glucose-Capillary: 123 mg/dL — ABNORMAL HIGH (ref 70–99)
Glucose-Capillary: 154 mg/dL — ABNORMAL HIGH (ref 70–99)
Glucose-Capillary: 97 mg/dL (ref 70–99)

## 2022-09-24 SURGERY — IRRIGATION AND DEBRIDEMENT EXTREMITY
Anesthesia: General | Laterality: Right

## 2022-09-24 MED ORDER — VANCOMYCIN HCL IN DEXTROSE 1-5 GM/200ML-% IV SOLN
1000.0000 mg | Freq: Once | INTRAVENOUS | Status: AC
  Administered 2022-09-24: 1000 mg via INTRAVENOUS
  Filled 2022-09-24: qty 200

## 2022-09-24 MED ORDER — SIMVASTATIN 20 MG PO TABS: 40.0000 mg | ORAL_TABLET | Freq: Every day | ORAL | Status: DC

## 2022-09-24 MED ORDER — ACETAMINOPHEN 650 MG RE SUPP: 650.0000 mg | Freq: Four times a day (QID) | RECTAL | Status: DC | PRN

## 2022-09-24 MED ORDER — OXYCODONE HCL 5 MG PO TABS
5.0000 mg | ORAL_TABLET | ORAL | Status: DC | PRN
  Administered 2022-09-26: 5 mg via ORAL
  Filled 2022-09-24: qty 1

## 2022-09-24 MED ORDER — DEXTROSE 50 % IV SOLN
INTRAVENOUS | Status: DC | PRN
  Administered 2022-09-24: .5 via INTRAVENOUS

## 2022-09-24 MED ORDER — SENNOSIDES-DOCUSATE SODIUM 8.6-50 MG PO TABS: 1.0000 | ORAL_TABLET | Freq: Every evening | ORAL | Status: DC | PRN

## 2022-09-24 MED ORDER — ONDANSETRON HCL 4 MG/2ML IJ SOLN
INTRAMUSCULAR | Status: AC
  Filled 2022-09-24: qty 2

## 2022-09-24 MED ORDER — BUPIVACAINE HCL (PF) 0.25 % IJ SOLN
INTRAMUSCULAR | Status: AC
  Filled 2022-09-24: qty 10

## 2022-09-24 MED ORDER — KETOROLAC TROMETHAMINE 30 MG/ML IJ SOLN
INTRAMUSCULAR | Status: AC
  Filled 2022-09-24: qty 1

## 2022-09-24 MED ORDER — PANTOPRAZOLE SODIUM 40 MG PO TBEC
40.0000 mg | DELAYED_RELEASE_TABLET | Freq: Every day | ORAL | Status: DC
  Administered 2022-09-25 – 2022-09-26 (×2): 40 mg via ORAL
  Filled 2022-09-24 (×2): qty 1

## 2022-09-24 MED ORDER — SUCCINYLCHOLINE CHLORIDE 20 MG/ML IJ SOLN
INTRAMUSCULAR | Status: DC | PRN
  Administered 2022-09-24: 110 mg via INTRAVENOUS

## 2022-09-24 MED ORDER — FENTANYL CITRATE (PF) 100 MCG/2ML IJ SOLN
INTRAMUSCULAR | Status: DC | PRN
  Administered 2022-09-24 (×2): 50 ug via INTRAVENOUS

## 2022-09-24 MED ORDER — FENTANYL CITRATE (PF) 100 MCG/2ML IJ SOLN
25.0000 ug | INTRAMUSCULAR | Status: DC | PRN
  Administered 2022-09-24: 25 ug via INTRAVENOUS

## 2022-09-24 MED ORDER — PROPOFOL 10 MG/ML IV BOLUS
INTRAVENOUS | Status: DC | PRN
  Administered 2022-09-24: 120 mg via INTRAVENOUS
  Administered 2022-09-24: 40 mg via INTRAVENOUS

## 2022-09-24 MED ORDER — INSULIN ASPART 100 UNIT/ML IJ SOLN
0.0000 [IU] | Freq: Every day | INTRAMUSCULAR | Status: DC
  Administered 2022-09-25: 2 [IU] via SUBCUTANEOUS

## 2022-09-24 MED ORDER — FENTANYL CITRATE (PF) 100 MCG/2ML IJ SOLN
INTRAMUSCULAR | Status: AC
  Filled 2022-09-24: qty 2

## 2022-09-24 MED ORDER — BUPIVACAINE HCL (PF) 0.25 % IJ SOLN
INTRAMUSCULAR | Status: DC | PRN
  Administered 2022-09-24: 10 mL

## 2022-09-24 MED ORDER — ONDANSETRON HCL 4 MG/2ML IJ SOLN: 4.0000 mg | Freq: Four times a day (QID) | INTRAMUSCULAR | Status: DC | PRN

## 2022-09-24 MED ORDER — ACETAMINOPHEN 325 MG PO TABS
650.0000 mg | ORAL_TABLET | Freq: Four times a day (QID) | ORAL | Status: DC | PRN
  Administered 2022-09-25 (×2): 650 mg via ORAL
  Filled 2022-09-24 (×2): qty 2

## 2022-09-24 MED ORDER — PROPOFOL 10 MG/ML IV BOLUS
INTRAVENOUS | Status: AC
  Filled 2022-09-24: qty 20

## 2022-09-24 MED ORDER — INSULIN ASPART 100 UNIT/ML IJ SOLN
0.0000 [IU] | Freq: Three times a day (TID) | INTRAMUSCULAR | Status: DC
  Administered 2022-09-25: 11 [IU] via SUBCUTANEOUS
  Administered 2022-09-25 – 2022-09-26 (×3): 5 [IU] via SUBCUTANEOUS

## 2022-09-24 MED ORDER — DEXAMETHASONE SODIUM PHOSPHATE 4 MG/ML IJ SOLN
INTRAMUSCULAR | Status: DC | PRN
  Administered 2022-09-24: 10 mg via INTRAVENOUS

## 2022-09-24 MED ORDER — ONDANSETRON HCL 4 MG/2ML IJ SOLN: 4.0000 mg | Freq: Once | INTRAMUSCULAR | Status: DC | PRN

## 2022-09-24 MED ORDER — LACTATED RINGERS IV SOLN: INTRAVENOUS | Status: DC | PRN

## 2022-09-24 MED ORDER — LIDOCAINE HCL (CARDIAC) PF 50 MG/5ML IV SOSY
PREFILLED_SYRINGE | INTRAVENOUS | Status: DC | PRN
  Administered 2022-09-24: 60 mg via INTRAVENOUS

## 2022-09-24 MED ORDER — ONDANSETRON HCL 4 MG PO TABS: 4.0000 mg | ORAL_TABLET | Freq: Four times a day (QID) | ORAL | Status: DC | PRN

## 2022-09-24 MED ORDER — KETOROLAC TROMETHAMINE 30 MG/ML IJ SOLN
30.0000 mg | Freq: Once | INTRAMUSCULAR | Status: AC
  Administered 2022-09-24: 30 mg via INTRAVENOUS

## 2022-09-24 MED ORDER — BUPROPION HCL ER (XL) 150 MG PO TB24: 300.0000 mg | ORAL_TABLET | Freq: Every day | ORAL | Status: DC

## 2022-09-24 MED ORDER — PIPERACILLIN-TAZOBACTAM 3.375 G IVPB
3.3750 g | Freq: Three times a day (TID) | INTRAVENOUS | Status: DC
  Administered 2022-09-25 – 2022-09-26 (×5): 3.375 g via INTRAVENOUS
  Filled 2022-09-24 (×5): qty 50

## 2022-09-24 MED ORDER — HYDRALAZINE HCL 20 MG/ML IJ SOLN: 5.0000 mg | INTRAMUSCULAR | Status: DC | PRN

## 2022-09-24 MED ORDER — HYDROMORPHONE HCL 1 MG/ML IJ SOLN
0.5000 mg | Freq: Once | INTRAMUSCULAR | Status: AC
  Administered 2022-09-24: 0.5 mg via INTRAVENOUS
  Filled 2022-09-24: qty 1

## 2022-09-24 MED ORDER — MIDAZOLAM HCL 5 MG/5ML IJ SOLN
INTRAMUSCULAR | Status: DC | PRN
  Administered 2022-09-24: 1 mg via INTRAVENOUS

## 2022-09-24 MED ORDER — ONDANSETRON HCL 4 MG/2ML IJ SOLN
INTRAMUSCULAR | Status: DC | PRN
  Administered 2022-09-24: 4 mg via INTRAVENOUS

## 2022-09-24 MED ORDER — MIDAZOLAM HCL 2 MG/2ML IJ SOLN
INTRAMUSCULAR | Status: AC
  Filled 2022-09-24: qty 2

## 2022-09-24 MED ORDER — FENTANYL CITRATE (PF) 250 MCG/5ML IJ SOLN
INTRAMUSCULAR | Status: AC
  Filled 2022-09-24: qty 5

## 2022-09-24 MED ORDER — VANCOMYCIN HCL 1250 MG/250ML IV SOLN
1250.0000 mg | INTRAVENOUS | Status: DC
  Administered 2022-09-25: 1250 mg via INTRAVENOUS
  Filled 2022-09-24: qty 250

## 2022-09-24 MED ORDER — ROSUVASTATIN CALCIUM 5 MG PO TABS
10.0000 mg | ORAL_TABLET | Freq: Every day | ORAL | Status: DC
  Administered 2022-09-25 – 2022-09-26 (×2): 10 mg via ORAL
  Filled 2022-09-24 (×2): qty 2

## 2022-09-24 MED ORDER — MORPHINE SULFATE (PF) 2 MG/ML IV SOLN: 2.0000 mg | INTRAVENOUS | Status: DC | PRN

## 2022-09-24 MED ORDER — ACETAMINOPHEN 10 MG/ML IV SOLN
INTRAVENOUS | Status: DC | PRN
  Administered 2022-09-24: 1000 mg via INTRAVENOUS

## 2022-09-24 MED ORDER — SODIUM CHLORIDE 0.9 % IV SOLN
INTRAVENOUS | Status: DC | PRN
  Administered 2022-09-24: 10 mL via INTRAVENOUS

## 2022-09-24 MED ORDER — FENTANYL CITRATE (PF) 100 MCG/2ML IJ SOLN: 25.0000 ug | INTRAMUSCULAR | Status: DC | PRN

## 2022-09-24 MED ORDER — ENOXAPARIN SODIUM 40 MG/0.4ML IJ SOSY
40.0000 mg | PREFILLED_SYRINGE | INTRAMUSCULAR | Status: DC
  Administered 2022-09-25 – 2022-09-26 (×2): 40 mg via SUBCUTANEOUS
  Filled 2022-09-24 (×2): qty 0.4

## 2022-09-24 SURGICAL SUPPLY — 60 items
BAG COUNTER SPONGE SURGICOUNT (BAG) ×1 IMPLANT
BNDG CONFORM 2 STRL LF (GAUZE/BANDAGES/DRESSINGS) IMPLANT
BNDG ELASTIC 2 VLCR STRL LF (GAUZE/BANDAGES/DRESSINGS) IMPLANT
BNDG ELASTIC 2X5.8 VLCR STR LF (GAUZE/BANDAGES/DRESSINGS) ×1 IMPLANT
BNDG ELASTIC 3X5.8 VLCR STR LF (GAUZE/BANDAGES/DRESSINGS) ×1 IMPLANT
BNDG ELASTIC 4X5.8 VLCR STR LF (GAUZE/BANDAGES/DRESSINGS) ×1 IMPLANT
BNDG ESMARK 4X9 LF (GAUZE/BANDAGES/DRESSINGS) IMPLANT
BNDG GAUZE DERMACEA FLUFF 4 (GAUZE/BANDAGES/DRESSINGS) ×2 IMPLANT
CHLORAPREP W/TINT 26 (MISCELLANEOUS) ×1 IMPLANT
CORD BIPOLAR FORCEPS 12FT (ELECTRODE) ×1 IMPLANT
COVER BACK TABLE 60X90IN (DRAPES) IMPLANT
COVER MAYO STAND STRL (DRAPES) ×1 IMPLANT
COVER SURGICAL LIGHT HANDLE (MISCELLANEOUS) ×1 IMPLANT
CUFF TOURN SGL QUICK 18X4 (TOURNIQUET CUFF) ×1 IMPLANT
CUFF TOURN SGL QUICK 24 (TOURNIQUET CUFF)
CUFF TRNQT CYL 24X4X16.5-23 (TOURNIQUET CUFF) IMPLANT
DRAIN PENROSE 18X1/4 LTX STRL (DRAIN) IMPLANT
DRAPE HALF SHEET 40X57 (DRAPES) ×1 IMPLANT
DRAPE OEC MINIVIEW 54X84 (DRAPES) IMPLANT
DRAPE SURG 17X23 STRL (DRAPES) ×1 IMPLANT
DRSG ADAPTIC 3X8 NADH LF (GAUZE/BANDAGES/DRESSINGS) ×1 IMPLANT
DRSG EMULSION OIL 3X3 NADH (GAUZE/BANDAGES/DRESSINGS) IMPLANT
GAUZE SPONGE 4X4 12PLY STRL (GAUZE/BANDAGES/DRESSINGS) ×1 IMPLANT
GAUZE XEROFORM 1X8 LF (GAUZE/BANDAGES/DRESSINGS) ×1 IMPLANT
GLOVE BIO SURGEON STRL SZ7 (GLOVE) ×1 IMPLANT
GLOVE SURG UNDER POLY LF SZ7 (GLOVE) ×1 IMPLANT
GOWN STRL REUS W/ TWL XL LVL3 (GOWN DISPOSABLE) ×2 IMPLANT
GOWN STRL REUS W/TWL XL LVL3 (GOWN DISPOSABLE) ×2
IV CATH 18G X1.75 CATHLON (IV SOLUTION) IMPLANT
KIT BASIN OR (CUSTOM PROCEDURE TRAY) ×1 IMPLANT
KIT TURNOVER KIT B (KITS) ×1 IMPLANT
LOOP VESSEL MAXI BLUE (MISCELLANEOUS) IMPLANT
MANIFOLD NEPTUNE II (INSTRUMENTS) IMPLANT
NDL HYPO 25GX1X1/2 BEV (NEEDLE) IMPLANT
NDL HYPO 25X1 1.5 SAFETY (NEEDLE) ×1 IMPLANT
NDL KEITH (NEEDLE) IMPLANT
NEEDLE HYPO 25GX1X1/2 BEV (NEEDLE) IMPLANT
NEEDLE HYPO 25X1 1.5 SAFETY (NEEDLE) ×1 IMPLANT
NEEDLE KEITH (NEEDLE) IMPLANT
NS IRRIG 1000ML POUR BTL (IV SOLUTION) ×1 IMPLANT
PACK ORTHO EXTREMITY (CUSTOM PROCEDURE TRAY) ×1 IMPLANT
PAD ABD 8X10 STRL (GAUZE/BANDAGES/DRESSINGS) ×1 IMPLANT
PAD ARMBOARD 7.5X6 YLW CONV (MISCELLANEOUS) ×1 IMPLANT
PAD CAST 4YDX4 CTTN HI CHSV (CAST SUPPLIES) ×2 IMPLANT
PADDING CAST ABS COTTON 3X4 (CAST SUPPLIES) ×1 IMPLANT
PADDING CAST COTTON 4X4 STRL (CAST SUPPLIES) ×2
SPLINT PLASTER CAST XFAST 3X15 (CAST SUPPLIES) ×1 IMPLANT
SPONGE T-LAP 4X18 ~~LOC~~+RFID (SPONGE) ×1 IMPLANT
SUT ETHILON 4 0 P 3 18 (SUTURE) IMPLANT
SUT FIBERWIRE 3-0 18 TAPR NDL (SUTURE)
SUTURE FIBERWR 3-0 18 TAPR NDL (SUTURE) IMPLANT
SWAB CULTURE ESWAB REG 1ML (MISCELLANEOUS) IMPLANT
SYR BULB EAR ULCER 3OZ GRN STR (SYRINGE) ×1 IMPLANT
SYR CONTROL 10ML LL (SYRINGE) IMPLANT
TOWEL GREEN STERILE (TOWEL DISPOSABLE) ×1 IMPLANT
TOWEL GREEN STERILE FF (TOWEL DISPOSABLE) ×2 IMPLANT
TUBE CONNECTING 12X1/4 (SUCTIONS) IMPLANT
UNDERPAD 30X36 HEAVY ABSORB (UNDERPADS AND DIAPERS) ×1 IMPLANT
WATER STERILE IRR 1000ML POUR (IV SOLUTION) ×1 IMPLANT
YANKAUER SUCT BULB TIP NO VENT (SUCTIONS) IMPLANT

## 2022-09-24 NOTE — Consult Note (Signed)
HAND SURGERY CONSULTATION  REQUESTING PHYSICIAN: Davonna Belling, MD   Chief Complaint: Right middle finger pain  HPI: Dorothy David is a 72 y.o. female with a history of poorly controlled type 2 diabetes (most recent A1c 9.8 --> 8.5) who presents with pain, swelling, and erythema of the right middle finger.  This started several days ago.  She does not recall any injury to the finger.  She denies any puncture wounds, cuts, or scrapes to this finger.  The pain and swelling have progressively worsened over the last few days.  She was given a prescription for oral doxycycline by her primary care provider.  The erythema now has spread to the level of the middle finger MCP joint and dorsum of the hand.  Her pain seems to be much more severe at the volar aspect of the finger.  Her pain is worse with attempted range of motion of this finger.  She denies pain in the remainder of her fingers or hand.  She denies any numbness or paresthesias.   Hand dominance: RHD   Past Medical History:  Diagnosis Date   Diabetes 1.5, 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[place])    Past Surgical History:  Procedure Laterality Date   ABDOMINAL HYSTERECTOMY     TONSILLECTOMY     Social History   Socioeconomic History   Marital status: Married    Spouse name: Not on file   Number of children: Not on file   Years of education: Not on file   Highest education level: Not on file  Occupational History   Not on file  Tobacco Use   Smoking status: Never   Smokeless tobacco: Never  Substance and Sexual Activity   Alcohol use: Yes    Comment: Rare   Drug use: Not Currently   Sexual activity: Not Currently  Other Topics Concern   Not on file  Social History Narrative   Not on file   Social Determinants of Health   Financial Resource Strain: Low Risk  (04/06/2022)   Overall Financial Resource Strain (CARDIA)    Difficulty of Paying Living Expenses: Not hard at all  Food Insecurity: No Food Insecurity (04/06/2022)   Hunger  Vital Sign    Worried About Running Out of Food in the Last Year: Never true    [place] in the Last Year: Never true  Transportation Needs: No Transportation Needs (04/06/2022)   PRAPARE - Hydrologist (Medical): No    Lack of Transportation (Non-Medical): No  Physical Activity: Not on file  Stress: Not on file  Social Connections: Not on file   Family History  Problem Relation Age of Onset   Breast cancer Neg Hx    - negative except otherwise stated in the family history section Allergies  Allergen Reactions   Codeine     Hot flashes   Metformin     Other Reaction(s): diarhea at high doses   Prior to Admission medications   Medication Sig Start Date End Date Taking? Authorizing Provider  buPROPion (WELLBUTRIN XL) 300 MG 24 hr tablet Take 300 mg by mouth daily.    [provider]  clobetasol ointment (TEMOVATE) 0.05 % SMARTSIG:1 Topical Every Night 09/05/20   [provider]  Continuous Blood Gluc Sensor (FREESTYLE LIBRE 2 SENSOR) MISC 1 Device by Does not apply route every 14 (fourteen) days. 12/03/21   Renato Shin, MD  Dulaglutide (TRULICITY) 1.5 0000000 SOPN Inject 1.5 mg into the skin  once a week. 09/16/22   Philemon Kingdom, MD  ibuprofen (ADVIL,MOTRIN) 200 MG tablet Take 200 mg by mouth every 6 (six) hours as needed (pain).    [provider]  insulin detemir (LEVEMIR FLEXTOUCH) 100 UNIT/ML FlexPen Inject 78 Units into the skin every morning. And 31g 86m pen needles 1/day 09/16/22   GPhilemon Kingdom MD  omeprazole (PRILOSEC) 40 MG capsule Take 40 mg by mouth daily.    [provider]  rosuvastatin (CRESTOR) 10 MG tablet Take 10 mg by mouth daily. 08/03/20   [provider]  simvastatin (ZOCOR) 40 MG tablet Take 40 mg by mouth daily.    [provider]   DG Hand Complete Right  Result Date: 09/23/2022 CLINICAL DATA:  Redness and swelling entire right middle finger for 4 hrs, felt like  something stuck in her finger yesterday EXAM: RIGHT HAND - COMPLETE 3+ VIEW COMPARISON:  None available FINDINGS: No fracture or dislocation. Mild diffuse swelling of the third digit. No radiopaque foreign body. Mild degenerative changes seen throughout the distal interphalangeal joints. IMPRESSION: Mild diffuse swelling of the third digit. No fracture or dislocation. No radiopaque foreign body. Electronically Signed   By: FMiachel RouxM.D.   On: 09/23/2022 11:25   - Positive ROS: All other systems have been reviewed and were otherwise negative with the exception of those mentioned in the HPI and as above.  Physical Exam: General: No acute distress, resting comfortably Cardiovascular: BUE warm and well perfused, normal rate Respiratory: Normal WOB on RA Skin: Warm and dry Neurologic: Sensation intact distally Psychiatric: Patient is at baseline mood and affect  Right Hand:  Diffuse swelling of the middle finger that seems to be centered over the level of the proximal phalanx and PIP joint.  The swelling seems to be more pronounced at the volar aspect of the finger.  Erythema of the proximal portion of the finger from the MCP joint to the level of the middle phalanx.  No open cuts or wounds.  No visible purulence.  No palpable fluctuance.  Extremely tender to palpation on the volar aspect of the middle finger from the area of the A1 pulley to the middle phalanx.  Minimally tender on the dorsum of the finger.  Non TTP in remainder of fingers/hand.  Finger is held in slight flexion.  Active range of motion limited by pain with severe pain on passive extension of the finger.  Sensation intact light touch in the radial and ulnar border of the middle finger.  Fingers warm well-perfused with brisk capillary refill.   Assessment: 72year old right-hand-dominant female with poorly controlled diabetes and atraumatic right middle finger pain, swelling, and erythema concerning for flexor  tenosynovitis.  Plan: -  I reviewed the nature of flexor tenosynovitis with the patient and her husband at bedside.  We discussed the relevant anatomy, diagnosis, prognosis, and treatment options.  We specifically discussed open irrigation of the flexor tendon sheath using incisions in the palm over the A1 pulley and distal aspect of the finger over the A5 pulley.  We also discussed continued observation with IV antibiotics with potential surgical treatment if her symptoms fail to improve.  We reviewed the risks of surgery which include bleeding, persistent infection, delayed wound healing, damage to the radial or ulnar neurovascular bundle, stiffness, need for prolonged wound care, need for occupational/hand therapy, need for additional surgery.  We discussed that her risks are increased given her poorly controlled diabetes.  After our discussion, patient has elected to  proceed with irrigation of the flexor tendon sheath tonight in the operating room. - Patient will be admitted to the hospital medicine service. - I will take intraoperative cultures to help guide antibiotic choice and duration - Recommend broad spectrum IV abx until culture results are obtained - We will start daily twice daily wound care tomorrow with soaks of the hand in warm, diluted Hibiclens solution and application of a clean, dry dressing.  Thank you for the consult and the opportunity to see Ms. Dorothy David, M.D. [facility] 7:10 PM

## 2022-09-24 NOTE — ED Notes (Signed)
Patient ambulated to restroom with a steady gait

## 2022-09-24 NOTE — Progress Notes (Signed)
Pacu RN Report to floor given  Gave report to  [employer]. Room: 5N13   Discussed surgery, meds given in OR and Pacu, VS, IV fluids given, EBL, urine output, pain and other pertinent information. Also discussed if pt had any family or friends here or belongings with them.   Pt is a diabetic, bl glucose in Pacu post op is 123. She had been given 1/2 amp of D50 in pre op for a blood sugar of 63. Pt has a dressing to her R middle finger of gauze and ace wrap, CDI, no drainage noted. Pain med was given in recovery as noted.   We have attempted to get a hold of Mr Gallardo and have been unable, his cell phone rings but does not have room to leave a message. We also called and spoke with Mrs Ings son Gerald Stabs and Evorn Gong. They have not heard from him. They are aware and will further investigate.   Pt exits my care.

## 2022-09-24 NOTE — Brief Op Note (Signed)
09/24/2022  9:43 PM  PATIENT:  Dorothy David  73 y.o. female  PRE-OPERATIVE DIAGNOSIS:  infected right middle finger  POST-OPERATIVE DIAGNOSIS:  infected right middle finger  PROCEDURE:  Procedure(s): IRRIGATION AND DEBRIDEMENT RIGHT MIDDLE FINGER (Right)  SURGEON:  Surgeon(s) and Role:    * Sherilyn Cooter, MD - Primary  PHYSICIAN ASSISTANT:   ASSISTANTS: none   ANESTHESIA:   local and general  EBL:  <5 mL   BLOOD ADMINISTERED:none  DRAINS: none   LOCAL MEDICATIONS USED:  10 cc 0.25% plain MARCAINE     SPECIMEN:  Source of Specimen:  Right middle finger sheath for microbiology  DISPOSITION OF SPECIMEN:   Micro  COUNTS:  YES  TOURNIQUET:  * Missing tourniquet times found for documented tourniquets in log: [id number] *  DICTATION: .Viviann Spare Dictation  PLAN OF CARE: Admit to inpatient   PATIENT DISPOSITION:  PACU - hemodynamically stable.   Delay start of Pharmacological VTE agent (>24hrs) due to surgical blood loss or risk of bleeding: no   I&D right middle finger flexor tendon sheath Gross purulence within tendon sheath Will follow up intra-op cultures Recommend broad spectrum IV abx for now Keep dressing clean and dry tonight Will start BID wound care tomorrow with soaks in warm, diluted Hibiclens solution   Sherilyn Cooter, M.D. [facility]

## 2022-09-24 NOTE — Anesthesia Procedure Notes (Signed)
Procedure Name: Intubation Date/Time: 09/24/2022 9:04 PM  Performed by: Suzy Bouchard, CRNAPre-anesthesia Checklist: Patient identified, Emergency Drugs available, Suction available, Patient being monitored and Timeout performed Patient Re-evaluated:Patient Re-evaluated prior to induction Oxygen Delivery Method: Circle system utilized Preoxygenation: Pre-oxygenation with 100% oxygen Induction Type: IV induction, Rapid sequence and Cricoid Pressure applied Laryngoscope Size: Miller and 2 Grade View: Grade I Tube type: Oral Tube size: 7.0 mm Number of attempts: 1 Airway Equipment and Method: Stylet Placement Confirmation: ETT inserted through vocal cords under direct vision, positive ETCO2 and breath sounds checked- equal and bilateral Secured at: 21 cm Tube secured with: Tape Dental Injury: Teeth and Oropharynx as per pre-operative assessment

## 2022-09-24 NOTE — H&P (View-Only) (Signed)
HAND SURGERY CONSULTATION  REQUESTING PHYSICIAN: Davonna Belling, MD   Chief Complaint: Right middle finger pain  HPI: Dorothy David is a 72 y.o. female with a history of poorly controlled type 2 diabetes (most recent A1c 9.8 --> 8.5) who presents with pain, swelling, and erythema of the right middle finger.  This started several days ago.  She does not recall any injury to the finger.  She denies any puncture wounds, cuts, or scrapes to this finger.  The pain and swelling have progressively worsened over the last few days.  She was given a prescription for oral doxycycline by her primary care provider.  The erythema now has spread to the level of the middle finger MCP joint and dorsum of the hand.  Her pain seems to be much more severe at the volar aspect of the finger.  Her pain is worse with attempted range of motion of this finger.  She denies pain in the remainder of her fingers or hand.  She denies any numbness or paresthesias.   Hand dominance: RHD   Past Medical History:  Diagnosis Date   Diabetes 1.5, 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[place])    Past Surgical History:  Procedure Laterality Date   ABDOMINAL HYSTERECTOMY     TONSILLECTOMY     Social History   Socioeconomic History   Marital status: Married    Spouse name: Not on file   Number of children: Not on file   Years of education: Not on file   Highest education level: Not on file  Occupational History   Not on file  Tobacco Use   Smoking status: Never   Smokeless tobacco: Never  Substance and Sexual Activity   Alcohol use: Yes    Comment: Rare   Drug use: Not Currently   Sexual activity: Not Currently  Other Topics Concern   Not on file  Social History Narrative   Not on file   Social Determinants of Health   Financial Resource Strain: Low Risk  (04/06/2022)   Overall Financial Resource Strain (CARDIA)    Difficulty of Paying Living Expenses: Not hard at all  Food Insecurity: No Food Insecurity (04/06/2022)   Hunger  Vital Sign    Worried About Running Out of Food in the Last Year: Never true    [place] in the Last Year: Never true  Transportation Needs: No Transportation Needs (04/06/2022)   PRAPARE - Hydrologist (Medical): No    Lack of Transportation (Non-Medical): No  Physical Activity: Not on file  Stress: Not on file  Social Connections: Not on file   Family History  Problem Relation Age of Onset   Breast cancer Neg Hx    - negative except otherwise stated in the family history section Allergies  Allergen Reactions   Codeine     Hot flashes   Metformin     Other Reaction(s): diarhea at high doses   Prior to Admission medications   Medication Sig Start Date End Date Taking? Authorizing Provider  buPROPion (WELLBUTRIN XL) 300 MG 24 hr tablet Take 300 mg by mouth daily.    [provider]  clobetasol ointment (TEMOVATE) 0.05 % SMARTSIG:1 Topical Every Night 09/05/20   [provider]  Continuous Blood Gluc Sensor (FREESTYLE LIBRE 2 SENSOR) MISC 1 Device by Does not apply route every 14 (fourteen) days. 12/03/21   Renato Shin, MD  Dulaglutide (TRULICITY) 1.5 0000000 SOPN Inject 1.5 mg into the skin  once a week. 09/16/22   Philemon Kingdom, MD  ibuprofen (ADVIL,MOTRIN) 200 MG tablet Take 200 mg by mouth every 6 (six) hours as needed (pain).    [provider]  insulin detemir (LEVEMIR FLEXTOUCH) 100 UNIT/ML FlexPen Inject 78 Units into the skin every morning. And 31g 64m pen needles 1/day 09/16/22   GPhilemon Kingdom MD  omeprazole (PRILOSEC) 40 MG capsule Take 40 mg by mouth daily.    [provider]  rosuvastatin (CRESTOR) 10 MG tablet Take 10 mg by mouth daily. 08/03/20   [provider]  simvastatin (ZOCOR) 40 MG tablet Take 40 mg by mouth daily.    [provider]   DG Hand Complete Right  Result Date: 09/23/2022 CLINICAL DATA:  Redness and swelling entire right middle finger for 4 hrs, felt like  something stuck in her finger yesterday EXAM: RIGHT HAND - COMPLETE 3+ VIEW COMPARISON:  None available FINDINGS: No fracture or dislocation. Mild diffuse swelling of the third digit. No radiopaque foreign body. Mild degenerative changes seen throughout the distal interphalangeal joints. IMPRESSION: Mild diffuse swelling of the third digit. No fracture or dislocation. No radiopaque foreign body. Electronically Signed   By: FMiachel RouxM.D.   On: 09/23/2022 11:25   - Positive ROS: All other systems have been reviewed and were otherwise negative with the exception of those mentioned in the HPI and as above.  Physical Exam: General: No acute distress, resting comfortably Cardiovascular: BUE warm and well perfused, normal rate Respiratory: Normal WOB on RA Skin: Warm and dry Neurologic: Sensation intact distally Psychiatric: Patient is at baseline mood and affect  Right Hand:  Diffuse swelling of the middle finger that seems to be centered over the level of the proximal phalanx and PIP joint.  The swelling seems to be more pronounced at the volar aspect of the finger.  Erythema of the proximal portion of the finger from the MCP joint to the level of the middle phalanx.  No open cuts or wounds.  No visible purulence.  No palpable fluctuance.  Extremely tender to palpation on the volar aspect of the middle finger from the area of the A1 pulley to the middle phalanx.  Minimally tender on the dorsum of the finger.  Non TTP in remainder of fingers/hand.  Finger is held in slight flexion.  Active range of motion limited by pain with severe pain on passive extension of the finger.  Sensation intact light touch in the radial and ulnar border of the middle finger.  Fingers warm well-perfused with brisk capillary refill.   Assessment: 72year old right-hand-dominant female with poorly controlled diabetes and atraumatic right middle finger pain, swelling, and erythema concerning for flexor  tenosynovitis.  Plan: -  I reviewed the nature of flexor tenosynovitis with the patient and her husband at bedside.  We discussed the relevant anatomy, diagnosis, prognosis, and treatment options.  We specifically discussed open irrigation of the flexor tendon sheath using incisions in the palm over the A1 pulley and distal aspect of the finger over the A5 pulley.  We also discussed continued observation with IV antibiotics with potential surgical treatment if her symptoms fail to improve.  We reviewed the risks of surgery which include bleeding, persistent infection, delayed wound healing, damage to the radial or ulnar neurovascular bundle, stiffness, need for prolonged wound care, need for occupational/hand therapy, need for additional surgery.  We discussed that her risks are increased given her poorly controlled diabetes.  After our discussion, patient has elected to  proceed with irrigation of the flexor tendon sheath tonight in the operating room. - Patient will be admitted to the hospital medicine service. - I will take intraoperative cultures to help guide antibiotic choice and duration - Recommend broad spectrum IV abx until culture results are obtained - We will start daily twice daily wound care tomorrow with soaks of the hand in warm, diluted Hibiclens solution and application of a clean, dry dressing.  Thank you for the consult and the opportunity to see Ms. Gennaro Africa, M.D. [facility] 7:10 PM

## 2022-09-24 NOTE — H&P (Signed)
PCP:   Lennie Odor, PA   Chief Complaint:  Infected right middle finger  HPI: This is a pleasant 72 year old female with past medical history of diabetes mellitus, dyslipidemia.  Per patient Thursday evening she noted that her middle finger of the right hand was a bit swollen.  This became progressively worse.  By Friday the entire finger was swollen and painful.  She saw her PCP who did an x-ray and started doxycycline.  X-ray was normal, her PCP recommended if the swelling got worse she should consider going to the ER.  Saturday morning it was much worse.  Her middle finger was completely red and twice its size.  The redness and swelling had now gone below the knuckles into the hand.  She was unable to make a fist.  The patient recalls no trauma or no irritation that caused this.  She denies fevers or chills.  Hand orthopedic contacted by EDP from [place].  Patient is maintained to the OR for I&D and washout.  Patient has not eaten since yesterday.  Her fingerstick blood sugars in the OR were 47 and 58.  Here on the floor patient strictly said blood sugars are 154 and 123.   Review of Systems:  The patient denies anorexia, fever, weight loss,, vision loss, decreased hearing, hoarseness, chest pain, syncope, dyspnea on exertion, peripheral edema, balance deficits, hemoptysis, abdominal pain, melena, hematochezia, severe indigestion/heartburn, hematuria, incontinence, genital sores, muscle weakness, suspicious skin lesions, transient blindness, difficulty walking, depression, unusual weight change, abnormal bleeding, enlarged lymph nodes, angioedema, and breast masses. Positives: Finger pain, erythema and swelling  Past Medical History: Past Medical History:  Diagnosis Date   Diabetes 1.5, 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[place])    Past Surgical History:  Procedure Laterality Date   ABDOMINAL HYSTERECTOMY     TONSILLECTOMY      Medications: Prior to Admission medications   Medication Sig Start Date  End Date Taking? Authorizing Provider  Dulaglutide (TRULICITY) 1.5 0000000 SOPN Inject 1.5 mg into the skin once a week. 09/16/22  Yes Philemon Kingdom, MD  buPROPion (WELLBUTRIN XL) 300 MG 24 hr tablet Take 300 mg by mouth daily.    [provider]  clobetasol ointment (TEMOVATE) 0.05 % SMARTSIG:1 Topical Every Night 09/05/20   [provider]  Continuous Blood Gluc Sensor (FREESTYLE LIBRE 2 SENSOR) MISC 1 Device by Does not apply route every 14 (fourteen) days. 12/03/21   Renato Shin, MD  ibuprofen (ADVIL,MOTRIN) 200 MG tablet Take 200 mg by mouth every 6 (six) hours as needed (pain).    [provider]  insulin detemir (LEVEMIR FLEXTOUCH) 100 UNIT/ML FlexPen Inject 78 Units into the skin every morning. And 31g 36m pen needles 1/day 09/16/22   GPhilemon Kingdom MD  omeprazole (PRILOSEC) 40 MG capsule Take 40 mg by mouth daily.    [provider]  rosuvastatin (CRESTOR) 10 MG tablet Take 10 mg by mouth daily. 08/03/20   [provider]  simvastatin (ZOCOR) 40 MG tablet Take 40 mg by mouth daily.    [provider]    Allergies:   Allergies  Allergen Reactions   Codeine     Hot flashes   Metformin     Other Reaction(s): diarhea at high doses    Social History:  reports that she has never smoked. She has never used smokeless tobacco. She reports current alcohol use. She reports that she does not currently use drugs.  Family History: Family History  Problem Relation Age of Onset  Breast cancer Neg Hx     Physical Exam: Vitals:   09/24/22 2240 09/24/22 2245 09/24/22 2250 09/24/22 2316  BP:  (!) 162/62  (!) 158/63  Pulse: 72 70 62 65  Resp: '17 13 10 18  '$ Temp:  97.6 F (36.4 C)  97.7 F (36.5 C)  TempSrc:    Oral  SpO2: 96% 100% 99% 100%  Weight:      Height:        General:  Alert and oriented times three, well developed and nourished, no acute distress Eyes: PERRLA, pink conjunctiva, no scleral icterus ENT: Moist oral  mucosa, neck supple, no thyromegaly Lungs: clear to ascultation, no wheeze, no crackles, no use of accessory muscles Cardiovascular: regular rate and rhythm, no regurgitation, no gallops, no murmurs. No carotid bruits, no JVD Abdomen: soft, positive BS, non-tender, non-distended, no organomegaly, not an acute abdomen GU: not examined Neuro: CN II - XII grossly intact, sensation intact Musculoskeletal: strength 5/5 all extremities.  Right hand/middle finger bandaged Skin: no rash, no subcutaneous crepitation, no decubitus Psych: appropriate patient   Labs on Admission:  Recent Labs    09/24/22 1431  NA 139  K 3.7  CL 105  CO2 26  GLUCOSE 225*  BUN 15  CREATININE 0.71  CALCIUM 9.2    Recent Labs    09/24/22 1431  WBC 8.3  HGB 14.1  HCT 41.3  MCV 93.9  PLT 195    Micro Results: Recent Results (from the past 240 hour(s))  Aerobic/Anaerobic Culture w Gram Stain (surgical/deep wound)     Status: None (Preliminary result)   Collection Time: 09/24/22  9:25 PM   Specimen: Finger, Right; Abscess  Result Value Ref Range Status   Specimen Description ABSCESS RIGHT FINGER  Final   Special Requests NONE  Final   Gram Stain   Final    RARE WBC PRESENT,BOTH PMN AND MONONUCLEAR NO ORGANISMS SEEN Performed at [facility], [address] [place], [place] [address]    Culture PENDING  Incomplete   Report Status PENDING  Incomplete     Radiological Exams on Admission: DG Hand Complete Right  Result Date: 09/23/2022 CLINICAL DATA:  Redness and swelling entire right middle finger for 4 hrs, felt like something stuck in her finger yesterday EXAM: RIGHT HAND - COMPLETE 3+ VIEW COMPARISON:  None available FINDINGS: No fracture or dislocation. Mild diffuse swelling of the third digit. No radiopaque foreign body. Mild degenerative changes seen throughout the distal interphalangeal joints. IMPRESSION: Mild diffuse swelling of the third digit. No fracture or dislocation. No  radiopaque foreign body. Electronically Signed   By: Miachel Roux M.D.   On: 09/23/2022 11:25    Assessment/Plan Present on Admission:  Finger infection/tensosynovitis -sp I&D 09/24/22.  IntraOp cultures sent -Continue broad-spectrum antibiotic Zosyn and vancomycin -BiD wound care in a.m. with soaks and warm diluted Hibiclens solutions per Ortho -PRN pain meds  DM type 2 -Patient takes 78 units Levemir qAM.  Additionally she does Trulicity.  She has not taken Trulicity in a week.  Given patient's borderline low blood sugars, Levemir has not been ordered.  Sliding scale insulin. -Diet ordered, no dextrose containing IV fluids  Hyperlipidemia -Crestor resumed   Megahn Killings 09/24/2022, 11:44 PM

## 2022-09-24 NOTE — ED Notes (Signed)
ED TO INPATIENT HANDOFF REPORT  ED Nurse Name and Phone #: Lequita Asal Name/Age/Gender Dorothy David 72 y.o. female Room/Bed: DB010/DB010  Code Status   Code Status: Not on file  Home/SNF/Other Home Patient oriented to: self, place, time, and situation Is this baseline? Yes   Triage Complete: Triage complete  Chief Complaint Finger infection [L08.9]  Triage Note Patient here POV from Home.  Endorses Thursday the Patient noted Swelling and Redness to Right Third Digit. Visited the PCP yesterday and had Imaging completed which was unremarkable. Swelling has worsened since. Prompting ED Visit.   NAD Noted during Triage. A&Ox4. GCS 15. Ambulatory.   Allergies Allergies  Allergen Reactions   Codeine     Hot flashes   Metformin     Other Reaction(s): diarhea at high doses    Level of Care/Admitting Diagnosis ED Disposition     ED Disposition  Admit   Condition  --   Comment  Hospital Area: [place] [[id number]]  Level of Care: Med-Surg [16]  Interfacility transfer: Yes  May place patient in observation at [facility] or [place] if equivalent level of care is available:: No  Covid Evaluation: Asymptomatic - no recent exposure (last 10 days) testing not required  Diagnosis: Finger infection [id number]  Admitting Physician: Aline August [id number]  Attending Physician: Aline August [id number]          B Medical/Surgery History Past Medical History:  Diagnosis Date   Diabetes 1.5, 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[facility])    Past Surgical History:  Procedure Laterality Date   ABDOMINAL HYSTERECTOMY     TONSILLECTOMY       A IV Location/Drains/Wounds Patient Lines/Drains/Airways Status     Active Line/Drains/Airways     Name Placement date Placement time Site Days   Peripheral IV 09/24/22 20 G 1.88" Anterior;Left Forearm 09/24/22  1712  Forearm  less than 1            Intake/Output Last 24 hours  Intake/Output Summary (Last 24 hours) at  09/24/2022 1938 Last data filed at 09/24/2022 1858 Gross per 24 hour  Intake 201.94 ml  Output --  Net 201.94 ml    Labs/Imaging Results for orders placed or performed during the hospital encounter of 09/24/22 (from the past 48 hour(s))  Basic metabolic panel     Status: Abnormal   Collection Time: 09/24/22  2:31 PM  Result Value Ref Range   Sodium 139 135 - 145 mmol/L   Potassium 3.7 3.5 - 5.1 mmol/L   Chloride 105 98 - 111 mmol/L   CO2 26 22 - 32 mmol/L   Glucose, Bld 225 (H) 70 - 99 mg/dL    Comment: Glucose reference range applies only to samples taken after fasting for at least 8 hours.   BUN 15 8 - 23 mg/dL   Creatinine, Ser 0.71 0.44 - 1.00 mg/dL   Calcium 9.2 8.9 - 10.3 mg/dL   GFR, Estimated >60 >60 mL/min    Comment: (NOTE) Calculated using the CKD-EPI Creatinine Equation (2021)    Anion gap 8 5 - 15    Comment: Performed at [facility], [address], [place], [place] [address]  CBC     Status: None   Collection Time: 09/24/22  2:31 PM  Result Value Ref Range   WBC 8.3 4.0 - 10.5 K/uL   RBC 4.40 3.87 - 5.11 MIL/uL   Hemoglobin 14.1 12.0 - 15.0 g/dL   HCT 41.3 36.0 - 46.0 %  MCV 93.9 80.0 - 100.0 fL   MCH 32.0 26.0 - 34.0 pg   MCHC 34.1 30.0 - 36.0 g/dL   RDW 13.0 11.5 - 15.5 %   Platelets 195 150 - 400 K/uL   nRBC 0.0 0.0 - 0.2 %    Comment: Performed at [facility], [address] [place] [address] [place], [place] [address]   DG Hand Complete Right  Result Date: 09/23/2022 CLINICAL DATA:  Redness and swelling entire right middle finger for 4 hrs, felt like something stuck in her finger yesterday EXAM: RIGHT HAND - COMPLETE 3+ VIEW COMPARISON:  None available FINDINGS: No fracture or dislocation. Mild diffuse swelling of the third digit. No radiopaque foreign body. Mild degenerative changes seen throughout the distal interphalangeal joints. IMPRESSION: Mild diffuse swelling of the third digit. No fracture or dislocation. No radiopaque  foreign body. Electronically Signed   By: Miachel Roux M.D.   On: 09/23/2022 11:25    Pending Labs Unresulted Labs (From admission, onward)    None       Vitals/Pain Today's Vitals   09/24/22 1428 09/24/22 1810 09/24/22 1830 09/24/22 1925  BP:    (!) 177/104  Pulse:   62 89  Resp:   12 17  Temp:  98.4 F (36.9 C)    TempSrc:  Oral    SpO2:   97% 100%  Weight: 71.2 kg     Height: '5\' 3"'$  (1.6 m)       Isolation Precautions No active isolations  Medications Medications  0.9 %  sodium chloride infusion (10 mLs Intravenous New Bag/Given 09/24/22 1716)  vancomycin (VANCOCIN) IVPB 1000 mg/200 mL premix (0 mg Intravenous Stopped 09/24/22 1858)  HYDROmorphone (DILAUDID) injection 0.5 mg (0.5 mg Intravenous Given 09/24/22 1713)  HYDROmorphone (DILAUDID) injection 0.5 mg (0.5 mg Intravenous Given 09/24/22 1925)    Mobility walks with person assist        R Recommendations: See Admitting Provider Note  Report given to:   Additional Notes: Patient ambulates with assist, will be going straight to surgery.  Dilaudid has worked well for pain control.

## 2022-09-24 NOTE — Progress Notes (Signed)
Pharmacy Antibiotic Note  Dorothy David is a 72 y.o. female admitted on 09/24/2022 with  infected right middle finger s/p I&D in the OR .  Pharmacy has been consulted for Vancomycin/Zosyn dosing. WBC WNL. Renal function good.   Plan: Vancomycin 1250 mg IV q24h >>>Estimated AUC: 516 Zosyn 3.375G IV q8h to be infused over 4 hours Trend WBC, temp, renal function  F/U infectious work-up Drug levels as indicated   Height: '5\' 3"'$  (160 cm) Weight: 71.2 kg (157 lb) IBW/kg (Calculated) : 52.4  Temp (24hrs), Avg:98 F (36.7 C), Min:97.6 F (36.4 C), Max:98.6 F (37 C)  Recent Labs  Lab 09/24/22 1431  WBC 8.3  CREATININE 0.71    Estimated Creatinine Clearance: 59.2 mL/min (by C-G formula based on SCr of 0.71 mg/dL).    Allergies  Allergen Reactions   Codeine     Hot flashes   Metformin     Other Reaction(s): diarhea at high doses    Narda Bonds, PharmD, BCPS Clinical Pharmacist Phone: [phone]

## 2022-09-24 NOTE — Anesthesia Preprocedure Evaluation (Addendum)
Anesthesia Evaluation  Patient identified by MRN, date of birth, ID band Patient awake    Reviewed: Allergy & Precautions, Patient's Chart, lab work & pertinent test results  Airway Mallampati: I  TM Distance: >3 FB     Dental  (+) Edentulous Upper, Edentulous Lower   Pulmonary neg pulmonary ROS   breath sounds clear to auscultation       Cardiovascular negative cardio ROS  Rhythm:Regular Rate:Normal     Neuro/Psych negative neurological ROS  negative psych ROS   GI/Hepatic Neg liver ROS,GERD  Medicated,,  Endo/Other  diabetes, Poorly Controlled, Type 2, Insulin Dependent, Oral Hypoglycemic Agents    Renal/GU negative Renal ROS     Musculoskeletal infected right middle finger    Abdominal   Peds  Hematology negative hematology ROS (+)   Anesthesia Other Findings   Reproductive/Obstetrics                             Anesthesia Physical Anesthesia Plan  ASA: 3 and emergent  Anesthesia Plan: General   Post-op Pain Management: Ofirmev IV (intra-op)* and Toradol IV (intra-op)*   Induction: Intravenous  PONV Risk Score and Plan: 3 and Dexamethasone and Ondansetron  Airway Management Planned: Oral ETT  Additional Equipment:   Intra-op Plan:   Post-operative Plan: Extubation in OR  Informed Consent: I have reviewed the patients History and Physical, chart, labs and discussed the procedure including the risks, benefits and alternatives for the proposed anesthesia with the patient or authorized representative who has indicated his/her understanding and acceptance.     Dental advisory given  Plan Discussed with: CRNA  Anesthesia Plan Comments:        Anesthesia Quick Evaluation

## 2022-09-24 NOTE — Interval H&P Note (Signed)
History and Physical Interval Note:  09/24/2022 8:51 PM  Dorothy David  has presented today for surgery, with the diagnosis of infected right middle finger.  The various methods of treatment have been discussed with the patient and family. After consideration of risks, benefits and other options for treatment, the patient has consented to  Procedure(s): IRRIGATION AND DEBRIDEMENT RIGHT MIDDLE FINGER (Right) as a surgical intervention.  The patient's history has been reviewed, patient examined, no change in status, stable for surgery.  I have reviewed the patient's chart and labs.  Questions were answered to the patient's satisfaction.     Darnice Comrie Vidal Lampkins

## 2022-09-24 NOTE — ED Notes (Signed)
ED Provider at bedside. 

## 2022-09-24 NOTE — ED Notes (Signed)
Report given to [facility]link at bedside.

## 2022-09-24 NOTE — ED Provider Notes (Signed)
[facility] Provider Note   CSN: [id number] Arrival date & time: 09/24/22  1416     History  Chief Complaint  Patient presents with   Hand Swelling    Dorothy David is a 72 y.o. female.  HPI Patient presents with right finger pain and swelling.  Began 3 days ago.  Saw PCP and had x-ray done yesterday.  Started on doxycycline.  Increasing pain and swelling today.  No known injury.  Cannot move the fingers and involving other fingers on the hand.  She is right-handed.  She is diabetic.   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 Authorizing Provider  Dulaglutide (TRULICITY) 1.5 0000000 SOPN Inject 1.5 mg into the skin once a week. 09/16/22  Yes Philemon Kingdom, MD  buPROPion (WELLBUTRIN XL) 300 MG 24 hr tablet Take 300 mg by mouth daily.    [provider]  clobetasol ointment (TEMOVATE) 0.05 % SMARTSIG:1 Topical Every Night 09/05/20   [provider]  Continuous Blood Gluc Sensor (FREESTYLE LIBRE 2 SENSOR) MISC 1 Device by Does not apply route every 14 (fourteen) days. 12/03/21   Renato Shin, MD  ibuprofen (ADVIL,MOTRIN) 200 MG tablet Take 200 mg by mouth every 6 (six) hours as needed (pain).    [provider]  insulin detemir (LEVEMIR FLEXTOUCH) 100 UNIT/ML FlexPen Inject 78 Units into the skin every morning. And 31g 73m pen needles 1/day 09/16/22   GPhilemon Kingdom MD  omeprazole (PRILOSEC) 40 MG capsule Take 40 mg by mouth daily.    [provider]  rosuvastatin (CRESTOR) 10 MG tablet Take 10 mg by mouth daily. 08/03/20   [provider]  simvastatin (ZOCOR) 40 MG tablet Take 40 mg by mouth daily.    [provider]      Allergies    Codeine and Metformin    Review of Systems   Review of Systems  Physical Exam Updated Vital Signs BP (!) 158/63  (BP Location: Left Arm)   Pulse 65   Temp 97.7 F (36.5 C) (Oral)   Resp 18   Ht '5\' 3"'$  (1.6 m)   Wt 71.2 kg   SpO2 100%   BMI 27.81 kg/m  Physical Exam Vitals and nursing note reviewed.  Skin:    Capillary Refill: Capillary refill takes less than 2 seconds.     Comments: Erythremia and swelling of right hand basically from MCP joints distally particularly on the third finger.  Swelling of the third finger particularly.  Neurological:     Mental Status: She is alert.          ED Results / Procedures / Treatments   Labs (all labs ordered are listed, but only abnormal results are displayed) Labs Reviewed  BASIC METABOLIC PANEL - Abnormal; Notable for the following components:      Result Value   Glucose, Bld 225 (*)    All other components within normal limits  GLUCOSE, CAPILLARY - Abnormal; Notable for the following components:   Glucose-Capillary 123 (*)    All other components within normal limits  GLUCOSE, CAPILLARY - Abnormal; Notable for the following components:   Glucose-Capillary 154 (*)    All other components within normal limits  AEROBIC/ANAEROBIC CULTURE W GRAM STAIN (SURGICAL/DEEP WOUND)  CBC    EKG None  Radiology DG Hand Complete  Right  Result Date: 09/23/2022 CLINICAL DATA:  Redness and swelling entire right middle finger for 4 hrs, felt like something stuck in her finger yesterday EXAM: RIGHT HAND - COMPLETE 3+ VIEW COMPARISON:  None available FINDINGS: No fracture or dislocation. Mild diffuse swelling of the third digit. No radiopaque foreign body. Mild degenerative changes seen throughout the distal interphalangeal joints. IMPRESSION: Mild diffuse swelling of the third digit. No fracture or dislocation. No radiopaque foreign body. Electronically Signed   By: Miachel Roux M.D.   On: 09/23/2022 11:25    Procedures Procedures    Medications Ordered in ED Medications  0.9 %  sodium chloride infusion ( Intravenous MAR Unhold 09/24/22 2300)   fentaNYL (SUBLIMAZE) 100 MCG/2ML injection (has no administration in time range)  ketorolac (TORADOL) 30 MG/ML injection (has no administration in time range)  ondansetron (ZOFRAN) 4 MG/2ML injection (has no administration in time range)  vancomycin (VANCOCIN) IVPB 1000 mg/200 mL premix (0 mg Intravenous Stopped 09/24/22 1858)  HYDROmorphone (DILAUDID) injection 0.5 mg (0.5 mg Intravenous Given 09/24/22 1713)  HYDROmorphone (DILAUDID) injection 0.5 mg (0.5 mg Intravenous Given 09/24/22 1925)  ketorolac (TORADOL) 30 MG/ML injection 30 mg (30 mg Intravenous Given 09/24/22 2226)    ED Course/ Medical Decision Making/ A&P                             Medical Decision Making Amount and/or Complexity of Data Reviewed Labs: ordered.  Risk Prescription drug management. Decision regarding hospitalization.   Patient with right third finger pain and swelling.  Also now involvement of second and third finger.  No fever.  Is diabetic.  Has had doxycycline without relief.  Cannot move finger.  Does put at risk for deeper infection.  X-ray done yesterday and reassuring with just the swelling.  Will discuss with hand surgery.  Discussed with Dr. Tempie Donning from hand surgery.  Request admission to internal medicine.  Start IV antibiotics.  Admit to [facility] and potentially OR tonight or tomorrow depending on exam.  Taken to preop.  Seen in the ER here by Dr. Tempie Donning.          Final Clinical Impression(s) / ED Diagnoses Final diagnoses:  Finger infection  Diabetes 1.5, managed as type 2 [facility])    Rx / DC Orders ED Discharge Orders     None         Davonna Belling, MD 09/24/22 2327

## 2022-09-24 NOTE — Transfer of Care (Signed)
Immediate Anesthesia Transfer of Care Note  Patient: WARD AMMIRATI  Procedure(s) Performed: IRRIGATION AND DEBRIDEMENT RIGHT MIDDLE FINGER (Right)  Patient Location: PACU  Anesthesia Type:General  Level of Consciousness: drowsy and responds to stimulation  Airway & Oxygen Therapy: Patient Spontanous Breathing  Post-op Assessment: Report given to RN and Post -op Vital signs reviewed and stable  Post vital signs: Reviewed and stable  Last Vitals:  Vitals Value Taken Time  BP    Temp    Pulse 69 09/24/22 2159  Resp 13 09/24/22 2159  SpO2 95 % 09/24/22 2159  Vitals shown include unvalidated device data.  Last Pain:  Vitals:   09/24/22 1810  TempSrc: Oral         Complications: No notable events documented.

## 2022-09-24 NOTE — ED Triage Notes (Signed)
Patient here POV from Home.  Endorses Thursday the Patient noted Swelling and Redness to Right Third Digit. Visited the PCP yesterday and had Imaging completed which was unremarkable. Swelling has worsened since. Prompting ED Visit.   NAD Noted during Triage. A&Ox4. GCS 15. Ambulatory.

## 2022-09-25 ENCOUNTER — Encounter (HOSPITAL_COMMUNITY): Payer: Self-pay | Admitting: Orthopedic Surgery

## 2022-09-25 DIAGNOSIS — M659 Synovitis and tenosynovitis, unspecified: Secondary | ICD-10-CM | POA: Diagnosis present

## 2022-09-25 DIAGNOSIS — Z90710 Acquired absence of both cervix and uterus: Secondary | ICD-10-CM | POA: Diagnosis not present

## 2022-09-25 DIAGNOSIS — E785 Hyperlipidemia, unspecified: Secondary | ICD-10-CM | POA: Diagnosis present

## 2022-09-25 DIAGNOSIS — L089 Local infection of the skin and subcutaneous tissue, unspecified: Secondary | ICD-10-CM | POA: Diagnosis present

## 2022-09-25 DIAGNOSIS — Z794 Long term (current) use of insulin: Secondary | ICD-10-CM | POA: Diagnosis not present

## 2022-09-25 DIAGNOSIS — Z888 Allergy status to other drugs, medicaments and biological substances status: Secondary | ICD-10-CM | POA: Diagnosis not present

## 2022-09-25 DIAGNOSIS — E1369 Other specified diabetes mellitus with other specified complication: Secondary | ICD-10-CM | POA: Diagnosis present

## 2022-09-25 DIAGNOSIS — E1365 Other specified diabetes mellitus with hyperglycemia: Secondary | ICD-10-CM | POA: Diagnosis present

## 2022-09-25 DIAGNOSIS — Z885 Allergy status to narcotic agent status: Secondary | ICD-10-CM | POA: Diagnosis not present

## 2022-09-25 DIAGNOSIS — Z79899 Other long term (current) drug therapy: Secondary | ICD-10-CM | POA: Diagnosis not present

## 2022-09-25 LAB — BASIC METABOLIC PANEL
Anion gap: 9 (ref 5–15)
BUN: 13 mg/dL (ref 8–23)
CO2: 24 mmol/L (ref 22–32)
Calcium: 8.3 mg/dL — ABNORMAL LOW (ref 8.9–10.3)
Chloride: 103 mmol/L (ref 98–111)
Creatinine, Ser: 0.64 mg/dL (ref 0.44–1.00)
GFR, Estimated: >60 mL/min (ref >60)
Glucose, Bld: 212 mg/dL — ABNORMAL HIGH (ref 70–99)
Potassium: 3.5 mmol/L (ref 3.5–5.1)
Sodium: 136 mmol/L (ref 135–145)

## 2022-09-25 LAB — GLUCOSE, CAPILLARY
Glucose-Capillary: 218 mg/dL — ABNORMAL HIGH (ref 70–99)
Glucose-Capillary: 336 mg/dL — ABNORMAL HIGH (ref 70–99)
Glucose-Capillary: 259 mg/dL — ABNORMAL HIGH (ref 70–99)
Glucose-Capillary: 231 mg/dL — ABNORMAL HIGH (ref 70–99)
Glucose-Capillary: 248 mg/dL — ABNORMAL HIGH (ref 70–99)
Glucose-Capillary: 226 mg/dL — ABNORMAL HIGH (ref 70–99)

## 2022-09-25 LAB — CBC WITH DIFFERENTIAL/PLATELET
Abs Immature Granulocytes: 0.04 10*3/uL (ref 0.00–0.07)
Basophils Absolute: 0 10*3/uL (ref 0.0–0.1)
Basophils Relative: 0 %
Eosinophils Absolute: 0 10*3/uL (ref 0.0–0.5)
Eosinophils Relative: 0 %
HCT: 37.9 % (ref 36.0–46.0)
Hemoglobin: 13.1 g/dL (ref 12.0–15.0)
Immature Granulocytes: 0 %
Lymphocytes Relative: 7 %
Lymphs Abs: 0.7 10*3/uL (ref 0.7–4.0)
MCH: 32.2 pg (ref 26.0–34.0)
MCHC: 34.6 g/dL (ref 30.0–36.0)
MCV: 93.1 fL (ref 80.0–100.0)
Monocytes Absolute: 0.1 10*3/uL (ref 0.1–1.0)
Monocytes Relative: 1 %
Neutro Abs: 9 10*3/uL — ABNORMAL HIGH (ref 1.7–7.7)
Neutrophils Relative %: 92 %
Platelets: 182 10*3/uL (ref 150–400)
RBC: 4.07 MIL/uL (ref 3.87–5.11)
RDW: 12.9 % (ref 11.5–15.5)
WBC: 9.8 10*3/uL (ref 4.0–10.5)
nRBC: 0 % (ref 0.0–0.2)

## 2022-09-25 MED ORDER — CHLORHEXIDINE GLUCONATE 4 % EX LIQD
Freq: Two times a day (BID) | CUTANEOUS | Status: DC
  Administered 2022-09-25: 1 via TOPICAL
  Filled 2022-09-25 (×3): qty 15

## 2022-09-25 MED ORDER — INSULIN DETEMIR 100 UNIT/ML ~~LOC~~ SOLN
30.0000 [IU] | Freq: Every day | SUBCUTANEOUS | Status: DC
  Administered 2022-09-25 – 2022-09-26 (×2): 30 [IU] via SUBCUTANEOUS
  Filled 2022-09-25 (×2): qty 0.3

## 2022-09-25 NOTE — Progress Notes (Signed)
PROGRESS NOTE    Dorothy David  [id number] DOB: 02-23-1951 DOA: 09/24/2022 PCP: Lennie Odor, PA   Brief Narrative: 72 year old with past medical history significant for diabetes, dyslipidemia started to have middle finger the right hand swollen.  Progressively getting worse.  She was evaluated by her PCP and she was started on doxycycline.  She presents to the ED for further evaluation of worsening right finger swollen   Assessment & Plan:   Principal Problem:   Finger infection Active Problems:   Type 2 diabetes mellitus with hyperglycemia, with long-term current use of insulin (HCC)   Flexor tenosynovitis of finger   Hyperlipidemia   1-Right Middle  finger infection, tenosynovitis -Ortho recommended wound care, soaks of right hand in warm, diluted Hibiclens solution and application of clean dry dressing twice a day. -Continue with IV antibiotics -Follow cultures report -she will need education for wound care.   2-Diabetes type II: CBG elevated today, was low yesterday. Plan to resume long-acting insulin lower home dose Levemir 30 units daily.  Continue with SSI  3-Hyperlipidemia: Continue with crestor.    Estimated body mass index is 27.81 kg/m as calculated from the following:   Height as of this encounter: '5\' 3"'$  (1.6 m).   Weight as of this encounter: 71.2 kg.   DVT prophylaxis: Lovenox Code Status: Full code Family Communication: Discussed with patient and husband who was at bedside Disposition Plan:  Status is: Observation The patient remains OBS appropriate and will d/c before 2 midnights.    Consultants:  Dr. Tempie Donning  Procedures:  Irrigation and debridement Right middle finger 2/24  Antimicrobials:    Subjective: She report pain right middle finger.  Her Blood sugar was low yesterday prior to Sx.     Objective: Vitals:   09/24/22 2245 09/24/22 2250 09/24/22 2316 09/25/22 0451  BP: (!) 162/62  (!) 158/63 130/61  Pulse: 70 62 65 83   Resp: '13 10 18 16  '$ Temp: 97.6 F (36.4 C)  97.7 F (36.5 C) 98 F (36.7 C)  TempSrc:   Oral Oral  SpO2: 100% 99% 100% 93%  Weight:      Height:        Intake/Output Summary (Last 24 hours) at 09/25/2022 0737 Last data filed at 09/25/2022 0400 Gross per 24 hour  Intake 746.88 ml  Output 5 ml  Net 741.88 ml   Filed Weights   09/24/22 1428  Weight: 71.2 kg    Examination:  General exam: Appears calm and comfortable  Respiratory system: Clear to auscultation. Respiratory effort normal. Cardiovascular system: S1 & S2 heard, RRR. No JVD, murmurs, rubs, gallops or clicks. No pedal edema. Gastrointestinal system: Abdomen is nondistended, soft and nontender. No organomegaly or masses felt. Normal bowel sounds heard. Central nervous system: Alert and oriented. No focal neurological deficits. Extremities: Symmetric 5 x 5 power. Skin: Right middle finger with dressing.   Data Reviewed: I have personally reviewed following labs and imaging studies  CBC: Recent Labs  Lab 09/24/22 1431 09/25/22 0334  WBC 8.3 9.8  NEUTROABS  --  9.0*  HGB 14.1 13.1  HCT 41.3 37.9  MCV 93.9 93.1  PLT 195 [id number]   Basic Metabolic Panel: Recent Labs  Lab 09/24/22 1431 09/25/22 0334  NA 139 136  K 3.7 3.5  CL 105 103  CO2 26 24  GLUCOSE 225* 212*  BUN 15 13  CREATININE 0.71 0.64  CALCIUM 9.2 8.3*   GFR: Estimated Creatinine Clearance: 59.2 mL/min (by C-G formula based  on SCr of 0.64 mg/dL). Liver Function Tests: No results for input(s): "AST", "ALT", "ALKPHOS", "BILITOT", "PROT", "ALBUMIN" in the last 168 hours. No results for input(s): "LIPASE", "AMYLASE" in the last 168 hours. No results for input(s): "AMMONIA" in the last 168 hours. Coagulation Profile: No results for input(s): "INR", "PROTIME" in the last 168 hours. Cardiac Enzymes: No results for input(s): "CKTOTAL", "CKMB", "CKMBINDEX", "TROPONINI" in the last 168 hours. BNP (last 3 results) No results for input(s): "PROBNP" in  the last 8760 hours. HbA1C: No results for input(s): "HGBA1C" in the last 72 hours. CBG: Recent Labs  Lab 09/24/22 2111 09/24/22 2156 09/24/22 2343 09/25/22 0448  GLUCAP 154* 123* 97 218*   Lipid Profile: No results for input(s): "CHOL", "HDL", "LDLCALC", "TRIG", "CHOLHDL", "LDLDIRECT" in the last 72 hours. Thyroid Function Tests: No results for input(s): "TSH", "T4TOTAL", "FREET4", "T3FREE", "THYROIDAB" in the last 72 hours. Anemia Panel: No results for input(s): "VITAMINB12", "FOLATE", "FERRITIN", "TIBC", "IRON", "RETICCTPCT" in the last 72 hours. Sepsis Labs: No results for input(s): "PROCALCITON", "LATICACIDVEN" in the last 168 hours.  Recent Results (from the past 240 hour(s))  Aerobic/Anaerobic Culture w Gram Stain (surgical/deep wound)     Status: None (Preliminary result)   Collection Time: 09/24/22  9:25 PM   Specimen: Finger, Right; Abscess  Result Value Ref Range Status   Specimen Description ABSCESS RIGHT FINGER  Final   Special Requests NONE  Final   Gram Stain   Final    RARE WBC PRESENT,BOTH PMN AND MONONUCLEAR NO ORGANISMS SEEN Performed at [facility], [address], [place], [place] [address]    Culture PENDING  Incomplete   Report Status PENDING  Incomplete         Radiology Studies: DG Hand Complete Right  Result Date: 09/23/2022 CLINICAL DATA:  Redness and swelling entire right middle finger for 4 hrs, felt like something stuck in her finger yesterday EXAM: RIGHT HAND - COMPLETE 3+ VIEW COMPARISON:  None available FINDINGS: No fracture or dislocation. Mild diffuse swelling of the third digit. No radiopaque foreign body. Mild degenerative changes seen throughout the distal interphalangeal joints. IMPRESSION: Mild diffuse swelling of the third digit. No fracture or dislocation. No radiopaque foreign body. Electronically Signed   By: Miachel Roux M.D.   On: 09/23/2022 11:25        Scheduled Meds:  buPROPion  300 mg Oral Daily   enoxaparin  (LOVENOX) injection  40 mg Subcutaneous Q24H   fentaNYL       insulin aspart  0-15 Units Subcutaneous TID WC   insulin aspart  0-5 Units Subcutaneous QHS   ketorolac       pantoprazole  40 mg Oral Daily   rosuvastatin  10 mg Oral Daily   Continuous Infusions:  sodium chloride 10 mL (09/24/22 1716)   piperacillin-tazobactam (ZOSYN)  IV 3.375 g (09/25/22 0552)   vancomycin       LOS: 0 days    Time spent: 35 minutes    Erza Mothershead A Fitzroy Mikami, MD [facility]   If 7PM-7AM, please contact night-coverage [email or web address]  09/25/2022, 7:37 AM

## 2022-09-25 NOTE — Progress Notes (Addendum)
   Subjective:  No acute events overnight.  Resting comfortably this AM.  Pain in finger is much improved.  Denies systemic symptoms.   Objective:   VITALS:   Vitals:   09/24/22 2250 09/24/22 2316 09/25/22 0451 09/25/22 0747  BP:  (!) 158/63 130/61 132/69  Pulse: 62 65 83 89  Resp: 10 18 16 18  $ Temp:  97.7 F (36.5 C) 98 F (36.7 C) 98 F (36.7 C)  TempSrc:  Oral Oral Oral  SpO2: 99% 100% 93% 95%  Weight:      Height:        Gen: NAD, resting comfortably Pulm: Normal WOB on RA CV: Normal rate, BUE warm and well perfused R hand: Incisions over middle finger A1 pulley, proximal phalanx, and A5 pulley are clean, dry, and well approximated.  No drainage from wounds.  Swelling and erythema of finger are much improved. Still with limited AROM of middle finger secondary to pain and swelling.  SILT radial and ulnar border of middle finger.  Fingers warm and well perfused w/ BCR.     Lab Results  Component Value Date   WBC 9.8 09/25/2022   HGB 13.1 09/25/2022   HCT 37.9 09/25/2022   MCV 93.1 09/25/2022   PLT 182 09/25/2022     Assessment/Plan:  72 yo F POD 1 s/p I&D of right middle finger.  There was mild purulence in flexor tendon sheath consistent with flexor tenosynovitis.  - Intra-op culture with no organism on gram stain - Continue broad spectrum IV abx - Will start BID wound care with soaks of right hand in warm, diluted Hibiclens solution and application of clean, dry dressing.  Discussed importance of wound care at home to maximize wound healing and infection clearance. - Can potentially be discharged to home today with broad spectrum oral abx if no speciation or culture results    Sherilyn Cooter, MD 09/25/2022, 9:39 AM [phone]

## 2022-09-25 NOTE — Care Management Obs Status (Signed)
[place] NOTIFICATION   Patient Details  Name: Dorothy David MRN: [id number] Date of Birth: [birth year]   Medicare Observation Status Notification Given:  Yes   Verbal consent, paper sent up to floor to give to patient Verdell Carmine, RN 09/25/2022, 3:39 PM

## 2022-09-25 NOTE — Anesthesia Postprocedure Evaluation (Signed)
Anesthesia Post Note  Patient: AANIYA MACZKA  Procedure(s) Performed: IRRIGATION AND DEBRIDEMENT RIGHT MIDDLE FINGER (Right)     Patient location during evaluation: PACU Anesthesia Type: General Level of consciousness: awake and alert Pain management: pain level controlled Vital Signs Assessment: post-procedure vital signs reviewed and stable Respiratory status: spontaneous breathing, nonlabored ventilation and respiratory function stable Cardiovascular status: blood pressure returned to baseline and stable Postop Assessment: no apparent nausea or vomiting Anesthetic complications: no   No notable events documented.  Last Vitals:  Vitals:   09/24/22 2316 09/25/22 0451  BP: (!) 158/63 130/61  Pulse: 65 83  Resp: 18 16  Temp: 36.5 C 36.7 C  SpO2: 100% 93%    Last Pain:  Vitals:   09/25/22 0451  TempSrc: Oral  PainSc:                  Santa Lighter

## 2022-09-25 NOTE — Plan of Care (Signed)
  Problem: Activity: Goal: Risk for activity intolerance will decrease Outcome: Progressing   Problem: Nutrition: Goal: Adequate nutrition will be maintained Outcome: Progressing   Problem: Coping: Goal: Level of anxiety will decrease Outcome: Progressing   

## 2022-09-26 ENCOUNTER — Encounter: Payer: Self-pay | Admitting: Orthopedic Surgery

## 2022-09-26 LAB — GLUCOSE, CAPILLARY
Glucose-Capillary: 47 mg/dL — ABNORMAL LOW (ref 70–99)
Glucose-Capillary: 63 mg/dL — ABNORMAL LOW (ref 70–99)
Glucose-Capillary: 69 mg/dL — ABNORMAL LOW (ref 70–99)
Glucose-Capillary: 207 mg/dL — ABNORMAL HIGH (ref 70–99)

## 2022-09-26 MED ORDER — AMOXICILLIN-POT CLAVULANATE 875-125 MG PO TABS: 1.0000 | ORAL_TABLET | Freq: Two times a day (BID) | ORAL | 0 refills | Status: AC

## 2022-09-26 MED ORDER — CHLORHEXIDINE GLUCONATE 4 % EX LIQD: Freq: Two times a day (BID) | CUTANEOUS | 0 refills | Status: DC

## 2022-09-26 MED ORDER — OXYCODONE HCL 5 MG PO TABS: 5.0000 mg | ORAL_TABLET | Freq: Four times a day (QID) | ORAL | 0 refills | Status: AC | PRN

## 2022-09-26 MED ORDER — DOXYCYCLINE HYCLATE 50 MG PO CAPS: 100.0000 mg | ORAL_CAPSULE | Freq: Two times a day (BID) | ORAL | 0 refills | Status: AC

## 2022-09-26 NOTE — Discharge Summary (Signed)
Physician Discharge Summary   Patient: Dorothy David MRN: [id number] DOB: 29-Sep-1950  Admit date:     09/24/2022  Discharge date: 09/26/22  Discharge Physician: Elmarie Shiley   PCP: Lennie Odor, PA   Recommendations at discharge:   Needs Follow up with Dr Tempie Donning, for further care of finger infection.   Discharge Diagnoses: Principal Problem:   Finger infection Active Problems:   Type 2 diabetes mellitus with hyperglycemia, with long-term current use of insulin (HCC)   Flexor tenosynovitis of finger   Hyperlipidemia  Resolved Problems:   * No resolved hospital problems. *  Hospital Course: 72 year old with past medical history significant for diabetes, dyslipidemia started to have middle finger the right hand swollen.  Progressively getting worse.  She was evaluated by her PCP and she was started on doxycycline.  She presents to the ED for further evaluation of worsening right finger swollen    Assessment and Plan: 1-Right Middle  finger infection, tenosynovitis -Ortho recommended wound care, soaks of right hand in warm, diluted Hibiclens solution and application of clean dry dressing twice a day. -Continue with IV antibiotics -Wound culture; no growth to date.  She will be discharge on Doxy and Augmentin for 2 weeks, discussed with ID>  Close follow up with Dr Tempie Donning.     2-Diabetes type II: CBG elevated today, was low yesterday. Resume Home regimen. Her endocrinologist recently increase Trulicity, ok to take higher dose. Advised to monitor CBG overnight at home.    3-Hyperlipidemia: Continue with crestor.        Consultants: Dr Tempie Donning.  Procedures performed: I and D of middle finger  Disposition: Home Diet recommendation:  Discharge Diet Orders (From admission, onward)     Start     Ordered   09/26/22 0000  Diet - low sodium heart healthy        09/26/22 1009           Cardiac diet DISCHARGE MEDICATION: Allergies as of 09/26/2022        Reactions   Codeine    Hot flashes   Metformin    Other Reaction(s): diarhea at high doses        Medication List     TAKE these medications    amoxicillin-clavulanate 875-125 MG tablet Commonly known as: AUGMENTIN Take 1 tablet by mouth 2 (two) times daily for 14 days.   buPROPion 300 MG 24 hr tablet Commonly known as: WELLBUTRIN XL Take 300 mg by mouth daily.   chlorhexidine 4 % external liquid Commonly known as: HIBICLENS Apply topically 2 (two) times daily.   clobetasol ointment 0.05 % Commonly known as: TEMOVATE SMARTSIG:1 Topical Every Night   doxycycline 50 MG capsule Commonly known as: VIBRAMYCIN Take 2 capsules (100 mg total) by mouth 2 (two) times daily for 14 days.   FreeStyle Libre 2 Sensor Misc 1 Device by Does not apply route every 14 (fourteen) days.   ibuprofen 200 MG tablet Commonly known as: ADVIL Take 200 mg by mouth every 6 (six) hours as needed (pain).   Levemir FlexTouch 100 UNIT/ML FlexPen Generic drug: insulin detemir Inject 78 Units into the skin every morning. And 31g 55m pen needles 1/day   omeprazole 40 MG capsule Commonly known as: PRILOSEC Take 40 mg by mouth daily.   oxyCODONE 5 MG immediate release tablet Commonly known as: Oxy IR/ROXICODONE Take 1 tablet (5 mg total) by mouth every 6 (six) hours as needed for up to 3 days for moderate pain.  rosuvastatin 10 MG tablet Commonly known as: CRESTOR Take 10 mg by mouth daily.   simvastatin 40 MG tablet Commonly known as: ZOCOR Take 40 mg by mouth daily.   Trulicity 1.5 0000000 Sopn Generic drug: Dulaglutide Inject 1.5 mg into the skin once a week.               Discharge Care Instructions  (From admission, onward)           Start     Ordered   09/26/22 0000  Discharge wound care:       Comments: See above   09/26/22 1009            Follow-up Information     Sherilyn Cooter, MD Follow up in 1 week(s).   Specialty: Orthopedic Surgery Contact  information: [address] [place] [address] [id number]                Discharge Exam: Danley Danker Weights   09/24/22 1428  Weight: 71.2 kg   General; NAD  Condition at discharge: stable  The results of significant diagnostics from this hospitalization (including imaging, microbiology, ancillary and laboratory) are listed below for reference.   Imaging Studies: DG Hand Complete Right  Result Date: 09/23/2022 CLINICAL DATA:  Redness and swelling entire right middle finger for 4 hrs, felt like something stuck in her finger yesterday EXAM: RIGHT HAND - COMPLETE 3+ VIEW COMPARISON:  None available FINDINGS: No fracture or dislocation. Mild diffuse swelling of the third digit. No radiopaque foreign body. Mild degenerative changes seen throughout the distal interphalangeal joints. IMPRESSION: Mild diffuse swelling of the third digit. No fracture or dislocation. No radiopaque foreign body. Electronically Signed   By: Miachel Roux M.D.   On: 09/23/2022 11:25    Microbiology: Results for orders placed or performed during the hospital encounter of 09/24/22  Aerobic/Anaerobic Culture w Gram Stain (surgical/deep wound)     Status: None (Preliminary result)   Collection Time: 09/24/22  9:25 PM   Specimen: Finger, Right; Abscess  Result Value Ref Range Status   Specimen Description ABSCESS RIGHT FINGER  Final   Special Requests NONE  Final   Gram Stain   Final    RARE WBC PRESENT,BOTH PMN AND MONONUCLEAR NO ORGANISMS SEEN    Culture   Final    NO GROWTH 2 DAYS Performed at [facility], [address], [place], [place] [address]    Report Status PENDING  Incomplete    Labs: CBC: Recent Labs  Lab 09/24/22 1431 09/25/22 0334  WBC 8.3 9.8  NEUTROABS  --  9.0*  HGB 14.1 13.1  HCT 41.3 37.9  MCV 93.9 93.1  PLT 195 [id number]   Basic Metabolic Panel: Recent Labs  Lab 09/24/22 1431 09/25/22 0334  NA 139 136  K 3.7 3.5  CL 105 103  CO2 26 24  GLUCOSE 225*  212*  BUN 15 13  CREATININE 0.71 0.64  CALCIUM 9.2 8.3*   Liver Function Tests: No results for input(s): "AST", "ALT", "ALKPHOS", "BILITOT", "PROT", "ALBUMIN" in the last 168 hours. CBG: Recent Labs  Lab 09/25/22 1120 09/25/22 1258 09/25/22 1646 09/25/22 2122 09/26/22 0817  GLUCAP 259* 231* 248* 226* 207*    Discharge time spent: greater than 30 minutes.  Signed: Elmarie Shiley, MD [facility] 09/26/2022

## 2022-09-26 NOTE — Op Note (Addendum)
Date of Surgery: 09/24/22  INDICATIONS: Patient is a 72 y.o.-year-old female with right middle finger pain, erythema, and swelling.  This has been going on for several days now.  She denies any injury to this finger and no breaks in the skin.  The swelling, pain, erythema are worsening despite oral antibiotics.  The pain seems worse at the volar aspect of the finger with significant tenderness to palpation along the proximal flexor tendon sheath.  She does have a history of poorly controlled type 2 diabetes with her most recent A1c values from 8.5 to 10.  We discussed continued observation with IV antibiotics versus irrigation of her flexor tendon sheath.  After a thorough discussion, including the risks and benefits of each treatment approach, patient has elected to proceed with irrigation and debridement of the right middle finger.  Risks, benefits, and alternatives to surgery were again discussed with the patient in the preoperative area. The patient wishes to proceed with surgery.  Informed consent was signed after our discussion.   PREOPERATIVE DIAGNOSIS:  Right middle finger suppurative flexor tenosynovitis  POSTOPERATIVE DIAGNOSIS: Same.  PROCEDURE:  Irrigation and drainage of right middle finger flexor tendon sheath [id number]) Right middle finger flexor tenosynovectomy [id number])   SURGEON: Audria Nine, M.D.  ASSIST: None  ANESTHESIA:  general, local  IV FLUIDS AND URINE: See anesthesia.  ESTIMATED BLOOD LOSS: <5 mL.  IMPLANTS: * No implants in log *   DRAINS: None  COMPLICATIONS: None  DESCRIPTION OF PROCEDURE: The patient was met in the preoperative holding area where the surgical site was marked and the consent form was signed.  The patient was then taken to the operating room and transferred to the operating table.  All bony prominences were well padded.  A tourniquet was applied to the right upper arm.  General endotracheal anesthesia was induced.  The operative extremity  was prepped and draped in the usual and sterile fashion.  A formal time-out was performed to confirm that this was the correct patient, surgery, side, and site.   Following formal timeout, the limb was gently exsanguinated by gravity and the tourniquet inflated to 250 mmHg.  I began by making an oblique incision at the proximal aspect of the A1 pulley.  The skin was incised.  Blunt dissection was used to identify the A1 pulley.  There was scant purulence in the area and coming from the pulley.  Aerobic and anaerobic cultures of this fluid were taken.  The proximal portion of the pulley was released sharply using a 15 blade scalpel.  I then turned my attention toward the distal aspect of the finger.  A Bruner style incision was made crossing the DIP flexion crease.  The skin was incised and full-thickness skin flaps were elevated.  Care was taken to protect the radial and ulnar digital neurovascular bundles.  The distal aspect of the A5 pulley was identified.  I then returned to the proximal aspect of the wound.  Retractors were placed both radially and ulnarly to protect the respective neurovascular bundles.  A 16-gauge Angiocath needle was then placed into the proximal portion of the flexor tendon sheath.  The tendon sheath was then flushed with copious sterile saline.  There was immediate return of purulent material from the distal aspect of the finger out of the A5 pulley.  The sheath was flushed with sterile saline until the fluid was clear.  Approximately 200 cc of fluid were flushed through the flexor tendon sheath.  The FDS and FDP  tendons were identified proximally.  There was some slight tenosynovial tissue surrounding the tendons.  This was sharply debrided with a tenotomy scissor.  A very small incision was made at the volar aspect of the finger at the level of the mid proximal phalanx.  There is no subcutaneous purulence with blunt dissection.  Wound was then irrigated with copious sterile saline.  A  single 4-0 nylon suture was placed at the mid aspect of the proximal oblique incision and at the apex of the distal Bruner style incision.  A local block was performed using 10 cc of quarter percent plain Marcaine.  The wounds were then dressed with Adaptic, 4 x 4's, cast padding, and an Ace wrap.  The patient was reversed from anesthesia and extubated uneventfully.  They were transferred from the operating table to the postoperative bed.  All counts were correct x 2 at the end of the procedure.  The patient was then taken to the PACU in stable condition.   POSTOPERATIVE PLAN: She will be admitted for broad-spectrum IV abx.  We will start BID wound care on POD 1.  Dispo pending pain control, wound care education, and final abx choice.   Audria Nine, MD 1:24 PM

## 2022-09-26 NOTE — Discharge Instructions (Signed)
wound care with soaks of right hand in warm, diluted Hibiclens solution and application of clean, dry dressing twice a day.

## 2022-09-29 LAB — AEROBIC/ANAEROBIC CULTURE W GRAM STAIN (SURGICAL/DEEP WOUND): Culture: NO GROWTH

## 2022-10-19 ENCOUNTER — Other Ambulatory Visit: Payer: Self-pay | Admitting: Internal Medicine

## 2022-10-20 DIAGNOSIS — M79644 Pain in right finger(s): Secondary | ICD-10-CM | POA: Diagnosis not present

## 2022-10-31 ENCOUNTER — Other Ambulatory Visit: Payer: Self-pay

## 2022-10-31 MED ORDER — FREESTYLE LIBRE 2 SENSOR MISC: 1.0000 | 3 refills | Status: DC

## 2022-11-03 ENCOUNTER — Other Ambulatory Visit: Payer: Self-pay | Admitting: *Deleted

## 2022-11-03 ENCOUNTER — Telehealth: Payer: Self-pay | Admitting: Internal Medicine

## 2022-11-03 DIAGNOSIS — G629 Polyneuropathy, unspecified: Secondary | ICD-10-CM | POA: Diagnosis not present

## 2022-11-03 DIAGNOSIS — E7800 Pure hypercholesterolemia, unspecified: Secondary | ICD-10-CM | POA: Diagnosis not present

## 2022-11-03 DIAGNOSIS — E1140 Type 2 diabetes mellitus with diabetic neuropathy, unspecified: Secondary | ICD-10-CM | POA: Diagnosis not present

## 2022-11-03 DIAGNOSIS — Z0000 Encounter for general adult medical examination without abnormal findings: Secondary | ICD-10-CM | POA: Diagnosis not present

## 2022-11-03 DIAGNOSIS — Z794 Long term (current) use of insulin: Secondary | ICD-10-CM | POA: Diagnosis not present

## 2022-11-03 DIAGNOSIS — E1169 Type 2 diabetes mellitus with other specified complication: Secondary | ICD-10-CM | POA: Diagnosis not present

## 2022-11-03 DIAGNOSIS — Z23 Encounter for immunization: Secondary | ICD-10-CM | POA: Diagnosis not present

## 2022-11-03 MED ORDER — TRULICITY 1.5 MG/0.5ML ~~LOC~~ SOAJ: 1.5000 mg | SUBCUTANEOUS | 3 refills | Status: DC

## 2022-11-03 NOTE — Telephone Encounter (Signed)
Re-sent medication to [facility]

## 2022-11-03 NOTE — Telephone Encounter (Signed)
Patient is calling to say that her pharmacy is telling her that Trulicity Dulaglutide (TRULICITY) 1.5 0000000 SOPN Is out of stock.  [facility] would [facility] [address], [place], [place] [address].

## 2022-11-07 ENCOUNTER — Other Ambulatory Visit: Payer: Self-pay | Admitting: Internal Medicine

## 2022-11-07 DIAGNOSIS — E1165 Type 2 diabetes mellitus with hyperglycemia: Secondary | ICD-10-CM

## 2022-11-10 MED ORDER — TRULICITY 3 MG/0.5ML ~~LOC~~ SOAJ: 3.0000 mg | SUBCUTANEOUS | 5 refills | Status: DC

## 2023-02-24 DIAGNOSIS — L900 Lichen sclerosus et atrophicus: Secondary | ICD-10-CM | POA: Diagnosis not present

## 2023-03-20 ENCOUNTER — Ambulatory Visit: Payer: Medicare Other | Admitting: Internal Medicine

## 2023-03-20 ENCOUNTER — Encounter: Payer: Self-pay | Admitting: Internal Medicine

## 2023-03-20 VITALS — BP 138/88 | HR 73 | Ht 63.0 in | Wt 157.0 lb

## 2023-03-20 DIAGNOSIS — Z794 Long term (current) use of insulin: Secondary | ICD-10-CM | POA: Diagnosis not present

## 2023-03-20 DIAGNOSIS — E785 Hyperlipidemia, unspecified: Secondary | ICD-10-CM | POA: Diagnosis not present

## 2023-03-20 DIAGNOSIS — E1165 Type 2 diabetes mellitus with hyperglycemia: Secondary | ICD-10-CM

## 2023-03-20 DIAGNOSIS — Z7985 Long-term (current) use of injectable non-insulin antidiabetic drugs: Secondary | ICD-10-CM

## 2023-03-20 DIAGNOSIS — E119 Type 2 diabetes mellitus without complications: Secondary | ICD-10-CM

## 2023-03-20 LAB — POCT GLYCOSYLATED HEMOGLOBIN (HGB A1C): Hemoglobin A1C: 8.7 % — AB (ref 4.0–5.6)

## 2023-03-20 MED ORDER — TRULICITY 3 MG/0.5ML ~~LOC~~ SOAJ: 3.0000 mg | SUBCUTANEOUS | 3 refills | Status: DC

## 2023-03-20 MED ORDER — LEVEMIR FLEXTOUCH 100 UNIT/ML ~~LOC~~ SOPN: 70.0000 [IU] | PEN_INJECTOR | SUBCUTANEOUS | Status: DC

## 2023-03-20 NOTE — Progress Notes (Signed)
Patient ID: Dorothy David, female   DOB: 1951-07-12, 72 y.o.   MRN: [id number]  HPI: Dorothy David is a 72 y.o.-year-old female, returning for follow-up for DM2, dx in 2002, insulin-dependent since dx., uncontrolled, without long-term complications. Pt. previously saw Dr. Everardo All, but last visit with me 6 months ago.  Interim history: No increased urination, blurry vision, nausea, chest pain. Since last visit, she had I&D for finger abscess. She was not able to get Trulicity for 4-5 mo after last OV-was only able to start it 1-2 mo ago.   Reviewed HbA1c: Lab Results  Component Value Date   HGBA1C 8.5 (A) 09/16/2022   HGBA1C 9.8 (A) 05/20/2022   HGBA1C 9.3 (A) 12/03/2021   HGBA1C 8.6 (A) 08/25/2021   HGBA1C 8.6 (A) 05/25/2021   HGBA1C 7.9 (A) 02/19/2021   HGBA1C 8.0 (A) 12/08/2020   HGBA1C 8.0 (A) 10/07/2020  02/04/2022: HbA1c calculated from fructosamine 7.68%.  Pt is on a regimen of: - Tradjenta 5 mg before b'fast  >> Trulicity 0.75 mg weekly-started 05/2022 >> 1.5 mg weekly - Levemir 70 >> 78 units in am  - frequently forgets She had vaginitis from Jardiance. She had nausea from Rybelsus. She had nausea and vomiting from Ozempic. She was started in Trulicity in the past >> tolerated well. She could not tolerate metformin ER even at the lowest dose due to diarrhea.  Pt is checking blood sugars >4x a day with her Freestyle Libre CGM:  Previously:   Lowest sugar was 60s >> 60s >> 30s (CGM) x1; she has hypoglycemia awareness at 70.  Highest sugar was 300s >> 300s >> 300s.  - no CKD, last BUN/creatinine:  Lab Results  Component Value Date   BUN 13 09/25/2022   BUN 15 09/24/2022   CREATININE 0.64 09/25/2022   CREATININE 0.71 09/24/2022  She is not on an ACE inhibitor/ARB.  - + HL; last set of lipids:    No results found for: "CHOL", "HDL", "LDLCALC", "LDLDIRECT", "TRIG", "CHOLHDL" On Crestor 10 mg daily.  - last eye exam was in 07/2022. No DR reportedly. Mild  Cataract.  - no numbness and tingling in her feet.  Last foot exam 05/20/2022.  She has a history of lichen sclerosus.  ROS: + see HPI  Past Medical History:  Diagnosis Date   Diabetes 1.5, 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HCC)    Past Surgical History:  Procedure Laterality Date   ABDOMINAL HYSTERECTOMY     I & D EXTREMITY Right 09/24/2022   Procedure: IRRIGATION AND DEBRIDEMENT RIGHT MIDDLE FINGER;  Surgeon: Marlyne Beards, MD;  Location: [facility] OR;  Service: Orthopedics;  Laterality: Right;   TONSILLECTOMY     Social History   Socioeconomic History   Marital status: Married    Spouse name: Not on file   Number of children: Not on file   Years of education: Not on file   Highest education level: Not on file  Occupational History   Not on file  Tobacco Use   Smoking status: Never   Smokeless tobacco: Never  Substance and Sexual Activity   Alcohol use: Yes    Comment: Rare   Drug use: Not Currently   Sexual activity: Not Currently  Other Topics Concern   Not on file  Social History Narrative   Not on file   Social Determinants of Health   Financial Resource Strain: Low Risk  (04/06/2022)   Overall Financial Resource Strain (CARDIA)    Difficulty of Paying Living Expenses: Not  hard at all  Food Insecurity: No Food Insecurity (04/06/2022)   Hunger Vital Sign    Worried About Running Out of Food in the Last Year: Never true    Ran Out of Food in the Last Year: Never true  Transportation Needs: No Transportation Needs (04/06/2022)   PRAPARE - Administrator, Civil Service (Medical): No    Lack of Transportation (Non-Medical): No  Physical Activity: Not on file  Stress: Not on file  Social Connections: Not on file  Intimate Partner Violence: Not on file   Current Outpatient Medications on File Prior to Visit  Medication Sig Dispense Refill   buPROPion (WELLBUTRIN XL) 300 MG 24 hr tablet Take 300 mg by mouth daily.     chlorhexidine (HIBICLENS) 4 % external liquid Apply  topically 2 (two) times daily. 120 mL 0   clobetasol ointment (TEMOVATE) 0.05 % SMARTSIG:1 Topical Every Night     Continuous Blood Gluc Sensor (FREESTYLE LIBRE 2 SENSOR) MISC 1 Device by Does not apply route every 14 (fourteen) days. 6 each 3   Dulaglutide (TRULICITY) 3 MG/0.5ML SOPN Inject 3 mg as directed once a week. 2 mL 5   ibuprofen (ADVIL,MOTRIN) 200 MG tablet Take 200 mg by mouth every 6 (six) hours as needed (pain).     insulin detemir (LEVEMIR FLEXTOUCH) 100 UNIT/ML FlexPen Inject 78 Units into the skin every morning. And 31g 5mm pen needles 1/day 75 mL 3   omeprazole (PRILOSEC) 40 MG capsule Take 40 mg by mouth daily.     rosuvastatin (CRESTOR) 10 MG tablet Take 10 mg by mouth daily.     simvastatin (ZOCOR) 40 MG tablet Take 40 mg by mouth daily.     No current facility-administered medications on file prior to visit.   Allergies  Allergen Reactions   Codeine     Hot flashes   Metformin     Other Reaction(s): diarhea at high doses   Family History  Problem Relation Age of Onset   Breast cancer Neg Hx    PE: BP 138/88   Pulse 73   Ht 5\' 3"  (1.6 m)   Wt 157 lb (71.2 kg)   SpO2 (!) 9%   BMI 27.81 kg/m  Wt Readings from Last 3 Encounters:  03/20/23 157 lb (71.2 kg)  09/24/22 157 lb (71.2 kg)  09/16/22 158 lb 3.2 oz (71.8 kg)   Constitutional: Slightly overweight, in NAD Eyes: no exophthalmos ENT: no thyromegaly, no cervical lymphadenopathy Cardiovascular: RRR, No MRG Respiratory: CTA B Musculoskeletal: no deformities Skin:  no rashes Neurological: no tremor with outstretched hands  ASSESSMENT: 1. DM2, insulin-dependent, uncontrolled, without long-term complications, but with hyperglycemia  2. HL  PLAN:  1. Patient with longstanding, uncontrolled, type 2 diabetes, weekly GLP-1 receptor agonist, increased at last visit, and long-acting insulin, with still poor control.  At last visit HbA1c was better, decreased from 9.8% to 8.5% but sugars were quite  fluctuating and increasing abruptly after breakfast.  Upon questioning, she was not compliant with her Levemir, frequently forgetting doses.  We also had loss of data on her CGM.  We discussed about scanning the device more frequently and also I advised her to set alarms on her phone to take the long-acting insulin.  She wanted to avoid mealtime insulin at that time.  We discussed about the absolute need to improve diet, especially breakfast. -Of note, she could not tolerate Ozempic and Rybelsus in the past. CGM interpretation: -At today's visit, we reviewed her  CGM downloads: It appears that 52% of values are in target range (goal >70%), while 45% are higher than 180 (goal <25%), and 3% are lower than 70 (goal <4%).  The calculated average blood sugar is 178.  The projected HbA1c for the next 3 months (GMI) is 7.8-7.9%. -Reviewing the CGM trends, sugars appear to be better than at last visit, but still quite fluctuating.  Upon questioning, she was only able to start back on Trulicity approximately 1 to 2 months ago (she is not sure) after being off for 4 to 5 months.  The predicted HbA1c for the last 2 weeks is actually lower than 8%, so it appears that the higher HbA1c obtained today's due to high blood sugars before restarting Trulicity.  At today's visit we discussed about increasing the dose of Trulicity, since she is tolerating it well.  Since she occasionally has lows, I advised her to decrease the dose of Levemir.  Patient has not Levemir at home but I advised her to let me know when she is close to running out as we need to change to another insulin 2/2 Levemir not being manufactured anymore. - I suggested to:  Patient Instructions  Please increase: - Trulicity 3 mg weekly  Please decrease: - Levemir 70 units in am - set alarms on your phone to take it.  Please return for another visit in 3 months.  - we checked her HbA1c: 8.7% (higher) - advised to check sugars at different times of the day  - 4x a day, rotating check times - advised for yearly eye exams >> she is UTD - return to clinic in 3 months  2. HL -Reviewed latest lipid panel from 10/2022: All fractions at goal (see HPI) No results found for: "CHOL", "HDL", "LDLCALC", "LDLDIRECT", "TRIG", "CHOLHDL" -She continues Crestor 10 mg daily without side effects  Carlus Pavlov, MD PhD [facility] Endocrinology

## 2023-03-20 NOTE — Patient Instructions (Addendum)
Please increase: - Trulicity 3 mg weekly  Please decrease: - Levemir 70 units in am - set alarms on your phone to take it.  Please return for another visit in 3 months.

## 2023-05-09 ENCOUNTER — Other Ambulatory Visit: Payer: Self-pay | Admitting: Physician Assistant

## 2023-05-09 DIAGNOSIS — Z1231 Encounter for screening mammogram for malignant neoplasm of breast: Secondary | ICD-10-CM

## 2023-06-13 ENCOUNTER — Ambulatory Visit
Admission: RE | Admit: 2023-06-13 | Discharge: 2023-06-13 | Disposition: A | Discharge status: Home / Self Care | Payer: Medicare Other | Source: Ambulatory Visit | Attending: Physician Assistant | Admitting: Physician Assistant

## 2023-06-13 DIAGNOSIS — Z1231 Encounter for screening mammogram for malignant neoplasm of breast: Secondary | ICD-10-CM

## 2023-07-20 ENCOUNTER — Ambulatory Visit: Payer: Medicare Other | Admitting: Internal Medicine

## 2023-07-20 ENCOUNTER — Encounter: Payer: Self-pay | Admitting: Internal Medicine

## 2023-07-20 VITALS — BP 122/70 | HR 75 | Ht 63.0 in | Wt 160.6 lb

## 2023-07-20 DIAGNOSIS — E785 Hyperlipidemia, unspecified: Secondary | ICD-10-CM | POA: Diagnosis not present

## 2023-07-20 DIAGNOSIS — Z794 Long term (current) use of insulin: Secondary | ICD-10-CM | POA: Diagnosis not present

## 2023-07-20 DIAGNOSIS — E1165 Type 2 diabetes mellitus with hyperglycemia: Secondary | ICD-10-CM

## 2023-07-20 LAB — POCT GLYCOSYLATED HEMOGLOBIN (HGB A1C): Hemoglobin A1C: 7.9 % — AB (ref 4.0–5.6)

## 2023-07-20 LAB — GLUCOSE, POCT (MANUAL RESULT ENTRY): POC Glucose: 112 mg/dL — AB (ref 70–99)

## 2023-07-20 MED ORDER — TIRZEPATIDE 2.5 MG/0.5ML ~~LOC~~ SOAJ: 2.5000 mg | SUBCUTANEOUS | 2 refills | Status: DC

## 2023-07-20 NOTE — Progress Notes (Signed)
Patient ID: ANDRESSA VESTER, female   DOB: [birth year], 72 y.o.   MRN: [id number]  HPI: KAEYA POWE is a 72 y.o.-year-old female, returning for follow-up for DM2, dx in 2002, insulin-dependent since dx., uncontrolled, without long-term complications. Pt. previously saw Dr. Everardo All, but last visit with me 4 months ago.  Interim history: No increased urination, blurry vision, nausea, chest pain.  Reviewed HbA1c: Lab Results  Component Value Date   HGBA1C 8.7 (A) 03/20/2023   HGBA1C 8.5 (A) 09/16/2022   HGBA1C 9.8 (A) 05/20/2022   HGBA1C 9.3 (A) 12/03/2021   HGBA1C 8.6 (A) 08/25/2021   HGBA1C 8.6 (A) 05/25/2021   HGBA1C 7.9 (A) 02/19/2021   HGBA1C 8.0 (A) 12/08/2020   HGBA1C 8.0 (A) 10/07/2020  02/04/2022: HbA1c calculated from fructosamine 7.68%.  Pt is on a regimen of: - Tradjenta 5 mg before b'fast  >> Trulicity 0.75 mg weekly-started 05/2022 >> 1.5mg  weekly (higher doses are more $$$) - Levemir 70 >> 78 >> 70 >> 74  units in am  - frequently forgetting in the past - now daily She had vaginitis from Jardiance. She had nausea from Rybelsus. She had nausea and vomiting from Ozempic. She was started in Trulicity in the past >> tolerated well. She could not tolerate metformin ER even at the lowest dose due to diarrhea.  Pt is checking blood sugars >4x a day with her Freestyle Libre CGM:  Previously:  Previously:   Lowest sugar was 60s >> 60s >> 30s (CGM) x1 >> 60; she has hypoglycemia awareness at 70.  Highest sugar was 300s >> 300s >> 300s >> 300  - no CKD, last BUN/creatinine:  Lab Results  Component Value Date   BUN 13 09/25/2022   BUN 15 09/24/2022   CREATININE 0.64 09/25/2022   CREATININE 0.71 09/24/2022   She is not on an ACE inhibitor/ARB.  - + HL; last set of lipids:    No results found for: "CHOL", "HDL", "LDLCALC", "LDLDIRECT", "TRIG", "CHOLHDL" On Crestor 10 mg daily.  - last eye exam was in 07/2022. No DR reportedly. Mild Cataract.  - no numbness and  tingling in her feet.  Last foot exam 03/20/2023.  She has a history of lichen sclerosus.  ROS: + see HPI  Past Medical History:  Diagnosis Date   Diabetes 1.5, 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HCC)    Past Surgical History:  Procedure Laterality Date   ABDOMINAL HYSTERECTOMY     I & D EXTREMITY Right 09/24/2022   Procedure: IRRIGATION AND DEBRIDEMENT RIGHT MIDDLE FINGER;  Surgeon: Marlyne Beards, MD;  Location: [facility] OR;  Service: Orthopedics;  Laterality: Right;   TONSILLECTOMY     Social History   Socioeconomic History   Marital status: Married    Spouse name: Not on file   Number of children: Not on file   Years of education: Not on file   Highest education level: Not on file  Occupational History   Not on file  Tobacco Use   Smoking status: Never   Smokeless tobacco: Never  Substance and Sexual Activity   Alcohol use: Yes    Comment: Rare   Drug use: Not Currently   Sexual activity: Not Currently  Other Topics Concern   Not on file  Social History Narrative   Not on file   Social Drivers of Health   Financial Resource Strain: Low Risk  (04/06/2022)   Overall Financial Resource Strain (CARDIA)    Difficulty of Paying Living Expenses: Not hard at all  Food Insecurity: No Food Insecurity (04/06/2022)   Hunger Vital Sign    Worried About Running Out of Food in the Last Year: Never true    Ran Out of Food in the Last Year: Never true  Transportation Needs: No Transportation Needs (04/06/2022)   PRAPARE - Administrator, Civil Service (Medical): No    Lack of Transportation (Non-Medical): No  Physical Activity: Not on file  Stress: Not on file  Social Connections: Not on file  Intimate Partner Violence: Not on file   Current Outpatient Medications on File Prior to Visit  Medication Sig Dispense Refill   buPROPion (WELLBUTRIN XL) 300 MG 24 hr tablet Take 300 mg by mouth daily.     chlorhexidine (HIBICLENS) 4 % external liquid Apply topically 2 (two) times daily. 120  mL 0   clobetasol ointment (TEMOVATE) 0.05 % SMARTSIG:1 Topical Every Night     Continuous Blood Gluc Sensor (FREESTYLE LIBRE 2 SENSOR) MISC 1 Device by Does not apply route every 14 (fourteen) days. 6 each 3   Dulaglutide (TRULICITY) 3 MG/0.5ML SOPN Inject 3 mg as directed once a week. 6 mL 3   ibuprofen (ADVIL,MOTRIN) 200 MG tablet Take 200 mg by mouth every 6 (six) hours as needed (pain).     insulin detemir (LEVEMIR FLEXTOUCH) 100 UNIT/ML FlexPen Inject 70 Units into the skin every morning.     omeprazole (PRILOSEC) 40 MG capsule Take 40 mg by mouth daily.     rosuvastatin (CRESTOR) 10 MG tablet Take 10 mg by mouth daily.     simvastatin (ZOCOR) 40 MG tablet Take 40 mg by mouth daily.     No current facility-administered medications on file prior to visit.   Allergies  Allergen Reactions   Codeine     Hot flashes   Metformin     Other Reaction(s): diarhea at high doses   Family History  Problem Relation Age of Onset   Breast cancer Sister 57   BRCA 1/2 Neg Hx    PE: BP 122/70   Pulse 75   Ht 5\' 3"  (1.6 m)   Wt 160 lb 9.6 oz (72.8 kg)   SpO2 96%   BMI 28.45 kg/m  Wt Readings from Last 3 Encounters:  07/20/23 160 lb 9.6 oz (72.8 kg)  03/20/23 157 lb (71.2 kg)  09/24/22 157 lb (71.2 kg)   Constitutional: Slightly overweight, in NAD Eyes: no exophthalmos ENT: no thyromegaly, no cervical lymphadenopathy Cardiovascular: RRR, No MRG Respiratory: CTA B Musculoskeletal: no deformities Skin:  no rashes Neurological: no tremor with outstretched hands  ASSESSMENT: 1. DM2, insulin-dependent, uncontrolled, without long-term complications, but with hyperglycemia  2. HL  PLAN:  1. Patient with longstanding, uncontrolled, type 2 diabetes, on weekly GLP-1 receptor agonist, increased at last visit, and long-acting insulin, decreased at last visit, with still poor control.  At last visit, HbA1c was 8.7%, slightly higher than before.  Sugars appears to be slightly improved, but  still quite fluctuating.  Upon questioning, she was only able to start back on Trulicity 1 to 2 months before our visit after being off for 4-5 months.  The CGM-predicted HbA1c was actually lower, at 8.0% at that time.  We discussed about setting alarms on her phone to remember to take Levemir but otherwise we did not change her regimen. -Of note, she could not tolerate Ozempic and Rybelsus in the past. CGM interpretation: -At today's visit, we reviewed her CGM downloads: It appears that 56% of values are in  target range (goal >70%), while 37% are higher than 180 (goal <25%), and 7% are lower than 70 (goal <4%).  The calculated average blood sugar is 156.  The projected HbA1c for the next 3 months (GMI) is 7.1%. -Reviewing the CGM trends, we do not have many sugars as she has loss of data from approximately 9 AM to midnight.  We discussed about the need to scan the device every 8 hours to avoid loss of data.  However, it is obvious that her blood sugars are dropping too much overnight and these are followed by an abrupt increase in blood sugars after breakfast.  Reviewing individual CGM days, it appears that sugars do increase abruptly after other meals, also.  I discussed with the patient that she is on too much long-acting insulin.  She increased it further since last visit to try to cover the postprandial excursions and I advised her that this is dangerous practice and would need to low blood sugars between meals and at night.  We will back off her long-acting insulin.  She was not able to obtain the 3 mg dose of Trulicity as the higher dose was even more expensive than the 1.5 mg dose.  She continues on this.  At today's visit we discussed about possibly switching to [facility] at a low dose and we can increase this as tolerated.  She previously had intolerance to Ozempic.  She does not feel that she would qualify for patient assistance. -I did advise her that if she tolerates Mounjaro well, to let me know so  I can send the higher dose to her pharmacy. - I suggested to:  Patient Instructions  Please try to change from Trulicity to: - Mounjaro 2.5 mg weekly  Decrease: - Levemir 57-58 units in am - set alarms on your phone to take it.   Please return for another visit in 3 months.  - we checked her HbA1c: 7.9% (lower) - advised to check sugars at different times of the day - 4x a day, rotating check times - advised for yearly eye exams >> she is UTD - return to clinic in 3 months  2. HL -Reviewed latest lipid panel from 10/2022: Fractions at goal - see HPI No results found for: "CHOL", "HDL", "LDLCALC", "LDLDIRECT", "TRIG", "CHOLHDL" -She continues Crestor 10 mg daily without side effects  Carlus Pavlov, MD PhD [facility]

## 2023-07-20 NOTE — Patient Instructions (Addendum)
Please try to change from Trulicity to: - Mounjaro 2.5 mg weekly  Decrease: - Levemir 57-58 units in am - set alarms on your phone to take it.   Please return for another visit in 3 months.

## 2023-08-30 DIAGNOSIS — L900 Lichen sclerosus et atrophicus: Secondary | ICD-10-CM | POA: Diagnosis not present

## 2023-10-02 ENCOUNTER — Other Ambulatory Visit: Payer: Self-pay | Admitting: Internal Medicine

## 2023-10-03 NOTE — Telephone Encounter (Signed)
 Medication refill request complete

## 2023-11-07 ENCOUNTER — Other Ambulatory Visit: Payer: Self-pay | Admitting: Physician Assistant

## 2023-11-07 DIAGNOSIS — E2839 Other primary ovarian failure: Secondary | ICD-10-CM

## 2023-11-07 DIAGNOSIS — Z136 Encounter for screening for cardiovascular disorders: Secondary | ICD-10-CM | POA: Diagnosis not present

## 2023-11-07 DIAGNOSIS — Z13820 Encounter for screening for osteoporosis: Secondary | ICD-10-CM | POA: Diagnosis not present

## 2023-11-07 DIAGNOSIS — K219 Gastro-esophageal reflux disease without esophagitis: Secondary | ICD-10-CM | POA: Diagnosis not present

## 2023-11-07 DIAGNOSIS — E1169 Type 2 diabetes mellitus with other specified complication: Secondary | ICD-10-CM | POA: Diagnosis not present

## 2023-11-07 DIAGNOSIS — Z0000 Encounter for general adult medical examination without abnormal findings: Secondary | ICD-10-CM | POA: Diagnosis not present

## 2023-11-07 DIAGNOSIS — Z23 Encounter for immunization: Secondary | ICD-10-CM | POA: Diagnosis not present

## 2023-11-07 DIAGNOSIS — E7800 Pure hypercholesterolemia, unspecified: Secondary | ICD-10-CM | POA: Diagnosis not present

## 2023-11-07 DIAGNOSIS — G629 Polyneuropathy, unspecified: Secondary | ICD-10-CM | POA: Diagnosis not present

## 2023-11-07 LAB — LAB REPORT - SCANNED
Creatinine, POC: 100 mg/dL
EGFR: 89
Microalb Creat Ratio: 11.2
Microalbumin, Urine: 1.12

## 2023-11-09 ENCOUNTER — Ambulatory Visit: Payer: Medicare Other | Admitting: Internal Medicine

## 2023-11-10 ENCOUNTER — Other Ambulatory Visit: Payer: Self-pay | Admitting: Internal Medicine

## 2023-11-13 ENCOUNTER — Ambulatory Visit: Admitting: Internal Medicine

## 2023-12-15 ENCOUNTER — Other Ambulatory Visit: Payer: Self-pay | Admitting: Internal Medicine

## 2023-12-15 NOTE — Telephone Encounter (Signed)
 Requested Prescriptions   Pending Prescriptions Disp Refills   MOUNJARO 2.5 MG/0.5ML Pen [Pharmacy Med Name: Mounjaro 2.5 MG/0.5ML Subcutaneous Solution Pen-injector] 4 mL 0    Sig: INJECT 2.5 MG INTO THE SKIN ONCE A WEEK

## 2024-02-08 ENCOUNTER — Ambulatory Visit: Admitting: Internal Medicine

## 2024-02-08 ENCOUNTER — Encounter: Payer: Self-pay | Admitting: Internal Medicine

## 2024-02-08 VITALS — BP 136/70 | HR 73 | Ht 63.0 in | Wt 156.8 lb

## 2024-02-08 DIAGNOSIS — Z794 Long term (current) use of insulin: Secondary | ICD-10-CM | POA: Diagnosis not present

## 2024-02-08 DIAGNOSIS — E785 Hyperlipidemia, unspecified: Secondary | ICD-10-CM

## 2024-02-08 DIAGNOSIS — L900 Lichen sclerosus et atrophicus: Secondary | ICD-10-CM | POA: Diagnosis not present

## 2024-02-08 DIAGNOSIS — E1165 Type 2 diabetes mellitus with hyperglycemia: Secondary | ICD-10-CM

## 2024-02-08 LAB — POCT GLYCOSYLATED HEMOGLOBIN (HGB A1C): Hemoglobin A1C: 7.8 % — AB (ref 4.0–5.6)

## 2024-02-08 MED ORDER — ACCU-CHEK GUIDE TEST VI STRP: ORAL_STRIP | 12 refills | Status: AC

## 2024-02-08 MED ORDER — ACCU-CHEK GUIDE W/DEVICE KIT: PACK | 0 refills | Status: AC

## 2024-02-08 MED ORDER — TIRZEPATIDE 5 MG/0.5ML ~~LOC~~ SOAJ: 5.0000 mg | SUBCUTANEOUS | 3 refills | Status: AC

## 2024-02-08 MED ORDER — FREESTYLE LIBRE 3 PLUS SENSOR MISC: 1.0000 | 3 refills | Status: DC

## 2024-02-08 MED ORDER — ACCU-CHEK SOFTCLIX LANCETS MISC: 12 refills | Status: AC

## 2024-02-08 NOTE — Patient Instructions (Addendum)
 Please increase: - Mounjaro  5 mg weekly  Continue: - Levemir  60 units in am   Please return for another visit in 3-4 months.

## 2024-02-08 NOTE — Progress Notes (Signed)
 Patient ID: Dorothy David, female   DOB: 02-19-1951, 73 y.o.   MRN: [id number]  HPI: Dorothy David is a 73 y.o.-year-old female, returning for follow-up for DM2, dx in 2002, insulin -dependent since dx., uncontrolled, without long-term complications. Pt. previously saw Dr. Kassie, but last visit with me 7 months ago.  Interim history: No increased urination, blurry vision, nausea, chest pain. She tells me she missed her last appointment as she had to take care of her husband, who was sick.  Reviewed HbA1c: 11/07/2023: HbA1c 7.9% ([facility]) Lab Results  Component Value Date   HGBA1C 7.9 (A) 07/20/2023   HGBA1C 8.7 (A) 03/20/2023   HGBA1C 8.5 (A) 09/16/2022   HGBA1C 9.8 (A) 05/20/2022   HGBA1C 9.3 (A) 12/03/2021   HGBA1C 8.6 (A) 08/25/2021   HGBA1C 8.6 (A) 05/25/2021   HGBA1C 7.9 (A) 02/19/2021   HGBA1C 8.0 (A) 12/08/2020   HGBA1C 8.0 (A) 10/07/2020  02/04/2022: HbA1c calculated from fructosamine 7.68%.  Pt is on a regimen of: - Tradjenta 5 mg before b'fast  >> Trulicity  0.75 mg weekly-started 05/2022 >> 1.5mg  weekly (higher doses are more $$$) >Mounjaro  2.5 mg weekly - Levemir  70 >> 78 >> 70 >> 74  units in am  - frequently forgetting in the past - now daily >> 60 units daily She had vaginitis from Jardiance. She had nausea from Rybelsus. She had nausea and vomiting from Ozempic . She was started in Trulicity  in the past >> tolerated well. She could not tolerate metformin  ER even at the lowest dose due to diarrhea.  Pt was checking blood sugars >4x a day with her Freestyle Libre CGM >> now off and not checking as she did not have a meter...  Previously:  Previously:  Lowest sugar was 30s (CGM) x1 >> 60 >> ?; she has hypoglycemia awareness at 70.  Highest sugar was 300s >> 300 >> ?  - no CKD, last BUN/creatinine:   Lab Results  Component Value Date   BUN 13 09/25/2022   BUN 15 09/24/2022   CREATININE 0.64 09/25/2022   CREATININE 0.71 09/24/2022   Lab Results  Component Value  Date   MICRALBCREAT 11.2 11/07/2023  She is not on an ACE inhibitor/ARB.  She had cough with lisinopril.  - + HL; last set of lipids:    No results found for: CHOL, HDL, LDLCALC, LDLDIRECT, TRIG, CHOLHDL On Crestor  10 mg daily.  - last eye exam was in 07/2022. No DR reportedly. Mild Cataract.  - no numbness and tingling in her feet.  Last foot exam 03/20/2023.  She has a history of lichen sclerosus.  ROS: + see HPI  Past Medical History:  Diagnosis Date   Diabetes 1.5, 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HCC)    Past Surgical History:  Procedure Laterality Date   ABDOMINAL HYSTERECTOMY     I & D EXTREMITY Right 09/24/2022   Procedure: IRRIGATION AND DEBRIDEMENT RIGHT MIDDLE FINGER;  Surgeon: Romona Harari, MD;  Location: [facility] OR;  Service: Orthopedics;  Laterality: Right;   TONSILLECTOMY     Social History   Socioeconomic History   Marital status: Married    Spouse name: Not on file   Number of children: Not on file   Years of education: Not on file   Highest education level: Not on file  Occupational History   Not on file  Tobacco Use   Smoking status: Never   Smokeless tobacco: Never  Substance and Sexual Activity   Alcohol use: Yes  Comment: Rare   Drug use: Not Currently   Sexual activity: Not Currently  Other Topics Concern   Not on file  Social History Narrative   Not on file   Social Drivers of Health   Financial Resource Strain: Low Risk  (04/06/2022)   Overall Financial Resource Strain (CARDIA)    Difficulty of Paying Living Expenses: Not hard at all  Food Insecurity: No Food Insecurity (04/06/2022)   Hunger Vital Sign    Worried About Running Out of Food in the Last Year: Never true    Ran Out of Food in the Last Year: Never true  Transportation Needs: No Transportation Needs (04/06/2022)   PRAPARE - Administrator, Civil Service (Medical): No    Lack of Transportation (Non-Medical): No  Physical Activity: Not on file  Stress: Not on  file  Social Connections: Not on file  Intimate Partner Violence: Not on file   Current Outpatient Medications on File Prior to Visit  Medication Sig Dispense Refill   buPROPion  (WELLBUTRIN  XL) 300 MG 24 hr tablet Take 300 mg by mouth daily.     chlorhexidine  (HIBICLENS ) 4 % external liquid Apply topically 2 (two) times daily. 120 mL 0   clobetasol ointment (TEMOVATE) 0.05 % SMARTSIG:1 Topical Every Night     Continuous Blood Gluc Sensor (FREESTYLE LIBRE 2 SENSOR) MISC 1 Device by Does not apply route every 14 (fourteen) days. 6 each 3   ibuprofen (ADVIL,MOTRIN) 200 MG tablet Take 200 mg by mouth every 6 (six) hours as needed (pain).     insulin  detemir (LEVEMIR  FLEXTOUCH) 100 UNIT/ML FlexPen Inject 70 Units into the skin every morning.     MOUNJARO  2.5 MG/0.5ML Pen INJECT 2.5 MG INTO THE SKIN ONCE A WEEK 4 mL 0   omeprazole (PRILOSEC) 40 MG capsule Take 40 mg by mouth daily.     rosuvastatin  (CRESTOR ) 10 MG tablet Take 10 mg by mouth daily.     simvastatin  (ZOCOR ) 40 MG tablet Take 40 mg by mouth daily. (Patient not taking: Reported on 07/20/2023)     No current facility-administered medications on file prior to visit.   Allergies  Allergen Reactions   Codeine     Hot flashes   Metformin      Other Reaction(s): diarhea at high doses   Family History  Problem Relation Age of Onset   Breast cancer Sister 29   BRCA 1/2 Neg Hx    PE: BP 136/70   Pulse 73   Ht 5' 3 (1.6 m)   Wt 156 lb 12.8 oz (71.1 kg)   SpO2 97%   BMI 27.78 kg/m  Wt Readings from Last 3 Encounters:  02/08/24 156 lb 12.8 oz (71.1 kg)  07/20/23 160 lb 9.6 oz (72.8 kg)  03/20/23 157 lb (71.2 kg)   Constitutional: Slightly overweight, in NAD Eyes: no exophthalmos ENT: no thyromegaly, no cervical lymphadenopathy Cardiovascular: RRR, No MRG Respiratory: CTA B Musculoskeletal: no deformities Skin:  no rashes Neurological: no tremor with outstretched hands Diabetic Foot Exam - Simple   Simple Foot  Form Diabetic Foot exam was performed with the following findings: Yes 02/08/2024  3:18 PM  Visual Inspection No deformities, no ulcerations, no other skin breakdown bilaterally: Yes Sensation Testing Intact to touch and monofilament testing bilaterally: Yes Pulse Check Posterior Tibialis and Dorsalis pulse intact bilaterally: Yes Comments + B medial halluceal calluses    ASSESSMENT: 1. DM2, insulin -dependent, uncontrolled, without long-term complications, but with hyperglycemia  2. HL  PLAN:  1. Patient with longstanding, uncontrolled, type 2 diabetes, on weekly GLP-1/GIP receptor agonist and daily long-acting insulin , with an improved HbA1c at last visit, at 7.9%, but still above target.  She had another HbA1c since last visit, 3 months ago, and this was stable, at 7.9%. -We had a lot of problems obtaining her GLP-1 receptor agonist in the past.  She was not able to use Trulicity  consistently in the past due to high price.  Ozempic  and Rybelsus were not well-tolerated in the past and she also did not feel that she could qualify for patient assistance.  I did recommend to try Mounjaro  at last visit.  - At last visit, we did not have many blood sugars to review in the CGM and she had data loss from approximately 9 AM to midnight.  I did advise her to try to scan the device every 8 hours.  She had high postprandial excursions and she was increasing her basal insulin  to cover these.  I advised her that this is dangerous practice, and conducive to low blood sugars between meals and at bedtime.  I advised her to try to start Mounjaro  and back off Levemir . -At today's visit, unfortunately, she is not checking blood sugars.  She stopped using the sensor approximately a month ago and she is not logged on the Tool app or have the log in information for us  to see the tracings.  She does not have a meter at home so at today's visit we called in and strongly advised her to always have a meter and check  blood sugars especially as she is on insulin . -The HbA1c today is slightly better.  I suspect the Mounjaro  is in for her and she is tolerating it well.  Will go ahead and increase the dose.  As of now, I did not advise her to lower the Levemir  dose, but I am hoping we can do so at next visit. -She does have a libre 2 sensor with her which I advised her to restart, but I also sent a prescription for the freestyle libre 3 CGM to her pharmacy. - I suggested to:  Patient Instructions  Please increase: - Mounjaro  5 mg weekly  Continue: - Levemir  60 units in am   Please return for another visit in 3-4 months.  - we checked her HbA1c: 7.8% (slightly lower) - advised to check sugars at different times of the day - 4x a day, rotating check times - advised for yearly eye exams >> she is due >> advised to schedule - return to clinic in 3-4 months  2. HL - Latest lipid panel was reviewed from 10/2022: All fractions at goal No results found for: CHOL, HDL, LDLCALC, LDLDIRECT, TRIG, CHOLHDL -She continues Crestor  10 mg daily without side effect  Lela Fendt, MD PhD [facility] Endocrin[facility]

## 2024-02-12 ENCOUNTER — Other Ambulatory Visit (HOSPITAL_COMMUNITY): Payer: Self-pay

## 2024-02-12 ENCOUNTER — Telehealth: Payer: Self-pay | Admitting: Pharmacy Technician

## 2024-02-12 NOTE — Telephone Encounter (Signed)
 Pharmacy Patient Advocate Encounter   Received notification from CoverMyMeds that prior authorization for FreeStyle Libre 3 Plus Sensor is required/requested.   Insurance verification completed.   The patient is insured through [facility] .   Per test claim: PA required; PA submitted to above mentioned insurance via CoverMyMeds Key/confirmation #/EOC [id number] Status is pending

## 2024-02-13 ENCOUNTER — Other Ambulatory Visit (HOSPITAL_COMMUNITY): Payer: Self-pay

## 2024-02-13 NOTE — Telephone Encounter (Signed)
 Pharmacy Patient Advocate Encounter  Received notification from [employer] that Prior Authorization for FreeStyle Libre 3 Plus Sensor has been APPROVED from 02/12/24 to 07/31/24. Ran test claim, Copay is $0.00. This test claim was processed through [facility]- copay amounts may vary at other pharmacies due to pharmacy/plan contracts, or as the patient moves through the different stages of their insurance plan.   PA #/Case ID/Reference #: [id number]

## 2024-04-08 ENCOUNTER — Other Ambulatory Visit: Payer: Self-pay

## 2024-04-08 MED ORDER — FREESTYLE LIBRE 3 PLUS SENSOR MISC: 1.0000 | 3 refills | Status: AC

## 2024-04-15 ENCOUNTER — Encounter: Payer: Self-pay | Admitting: Cardiology

## 2024-04-15 ENCOUNTER — Ambulatory Visit: Attending: Cardiology | Admitting: Cardiology

## 2024-04-15 VITALS — BP 143/83 | HR 83 | Ht 63.0 in | Wt 149.0 lb

## 2024-04-15 DIAGNOSIS — Z794 Long term (current) use of insulin: Secondary | ICD-10-CM | POA: Diagnosis not present

## 2024-04-15 DIAGNOSIS — Z01812 Encounter for preprocedural laboratory examination: Secondary | ICD-10-CM | POA: Diagnosis not present

## 2024-04-15 DIAGNOSIS — E785 Hyperlipidemia, unspecified: Secondary | ICD-10-CM

## 2024-04-15 DIAGNOSIS — E1165 Type 2 diabetes mellitus with hyperglycemia: Secondary | ICD-10-CM

## 2024-04-15 DIAGNOSIS — R072 Precordial pain: Secondary | ICD-10-CM

## 2024-04-15 MED ORDER — METOPROLOL TARTRATE 100 MG PO TABS: 100.0000 mg | ORAL_TABLET | ORAL | 0 refills | Status: AC

## 2024-04-15 NOTE — Patient Instructions (Signed)
 Medication Instructions:  The current medical regimen is effective;  continue present plan and medications.  *If you need a refill on your cardiac medications before your next appointment, please call your pharmacy*  Lab Work: Please have blood work today on the 1st floor  (BMP)  If you have labs (blood work) drawn today and your tests are completely normal, you will receive your results only by: MyChart Message (if you have MyChart) OR A paper copy in the mail If you have any lab test that is abnormal or we need to change your treatment, we will call you to review the results.  Testing/Procedures:   Your cardiac CT will be scheduled at:    [facility] [address]  [place], [place] [address] [phone]  Please enter the parking lot using the [address] entrance and use the FREE valet service at the patient drop-off area. Enter the building and check-in with registration on the main floor.  Please follow these instructions carefully (unless otherwise directed):  An IV will be required for this test and Nitroglycerin will be given.  Hold all erectile dysfunction medications at least 3 days (72 hrs) prior to test. (Ie viagra, cialis, sildenafil, tadalafil, etc)   On the Night Before the Test: Be sure to Drink plenty of water. Do not consume any caffeinated/decaffeinated beverages or chocolate 12 hours prior to your test. Do not take any antihistamines 12 hours prior to your test.  On the Day of the Test: Drink plenty of water until 1 hour prior to the test. Do not eat any food 1 hour prior to test. You may take your regular medications prior to the test.  Take metoprolol  (Lopressor ) two hours prior to test. If you take Furosemide/Hydrochlorothiazide/Spironolactone/Chlorthalidone, please HOLD on the morning of the test. Patients who wear a continuous glucose monitor MUST remove the device prior to scanning. FEMALES- please wear  underwire-free bra if available, avoid dresses & tight clothing      After the Test: Drink plenty of water. After receiving IV contrast, you may experience a mild flushed feeling. This is normal. On occasion, you may experience a mild rash up to 24 hours after the test. This is not dangerous. If this occurs, you can take Benadryl 25 mg, Zyrtec, Claritin, or Allegra and increase your fluid intake. (Patients taking Tikosyn should avoid Benadryl, and may take Zyrtec, Claritin, or Allegra) If you experience trouble breathing, this can be serious. If it is severe call 911 IMMEDIATELY. If it is mild, please call our office.  We will call to schedule your test 2-4 weeks out understanding that some insurance companies will need an authorization prior to the service being performed.   For more information and frequently asked questions, please visit our website : [email or web address]  For non-scheduling related questions, please contact the cardiac imaging nurse navigator should you have any questions/concerns: Cardiac Imaging Nurse Navigators Direct Office Dial: [phone]   For scheduling needs, including cancellations and rescheduling, please call [place], [phone].   Follow-Up: At [facility], you and your health needs are our priority.  As part of our continuing mission to provide you with exceptional heart care, our providers are all part of one team.  This team includes your primary Cardiologist (physician) and Advanced Practice Providers or APPs (Physician Assistants and Nurse Practitioners) who all work together to provide you with the care you need, when you need it.  Your next appointment:   Follow up will be  based on the results of the above testing.   We recommend signing up for the patient portal called MyChart.  Sign up information is provided on this After Visit Summary.  MyChart is used to connect with patients for Virtual Visits (Telemedicine).   Patients are able to view lab/test results, encounter notes, upcoming appointments, etc.  Non-urgent messages can be sent to your provider as well.   To learn more about what you can do with MyChart, go to [email or web address].

## 2024-04-15 NOTE — Progress Notes (Signed)
 Cardiology Office Note:  .   Date:  04/15/2024  ID:  RHANDI DESPAIN, DOB 1950-10-27, MRN [id number] PCP: Alvera Reagin, PA  [facility] Providers Cardiologist:  None     History of Present Illness: .   NIOKA THORINGTON is a 73 y.o. female Discussed the use of AI scribe software   History of Present Illness SHALA BAUMBACH is a 73 year old female with diabetes who presents for evaluation of heart health due to previous episodes of shortness of breath and chest tightness.  Several months ago, she experienced episodes of shortness of breath and a sensation of heaviness or tightness in her chest. These symptoms have not been frequent recently, but occasionally she does not feel quite right. No recent significant shortness of breath, fainting episodes, or difficulty with exertion such as climbing hills.  She has a history of diabetes, with a recent hemoglobin A1c of 7.8. She is currently on Mounjaro  for diabetes management and uses a sensor to monitor her glucose levels, although she has been unable to check it recently due to losing her phone. She also takes rosuvastatin  for cholesterol management, which she was prescribed due to her diabetes, despite not having high cholesterol. Her last LDL cholesterol level was 6.  Her mother had a slight heart attack but is still living at 75 years old, and her father, who is 52, has not had any heart problems. She has never smoked and reports a preference for sweets, which developed after her diabetes diagnosis.     Studies Reviewed: SABRA   EKG Interpretation Date/Time:  Monday April 15 2024 11:37:19 EDT Ventricular Rate:  83 PR Interval:  180 QRS Duration:  70 QT Interval:  366 QTC Calculation: 430 R Axis:   24  Text Interpretation: Normal sinus rhythm Cannot rule out Anterior infarct , age undetermined When compared with ECG of 29-May-2014 08:44, No significant change since last tracing Confirmed by Jeffrie Anes ([id number]) on 04/15/2024 11:42:37 AM     Results LABS Hemoglobin A1c: 7.8% LDL: 68 mg/dL Risk Assessment/Calculations:           Physical Exam:   VS:  BP (!) 143/83 (BP Location: Left Arm, Patient Position: Sitting, Cuff Size: Normal)   Pulse 83   Ht 5' 3 (1.6 m)   Wt 149 lb (67.6 kg)   SpO2 97%   BMI 26.39 kg/m    Wt Readings from Last 3 Encounters:  04/15/24 149 lb (67.6 kg)  02/08/24 156 lb 12.8 oz (71.1 kg)  07/20/23 160 lb 9.6 oz (72.8 kg)    GEN: Well nourished, well developed in no acute distress NECK: No JVD; No carotid bruits CARDIAC: RRR, no murmurs, no rubs, no gallops RESPIRATORY:  Clear to auscultation without rales, wheezing or rhonchi  ABDOMEN: Soft, non-tender, non-distended EXTREMITIES:  No edema; No deformity   ASSESSMENT AND PLAN: .    Assessment and Plan Assessment & Plan Coronary artery disease Intermittent chest discomfort with resolved symptoms and no significant family history. Diabetes is a strong risk factor, necessitating further evaluation. - Order coronary CT scan to assess for blockages or significant coronary disease.  Type 2 diabetes mellitus with hyperglycemia Recent hemoglobin A1c of 7.8 indicates hyperglycemia. Current management includes Mounjaro  and a glucose sensor, though glucose monitoring is hindered by loss of phone. Emphasis on lifestyle modifications, particularly dietary changes. - Continue Mounjaro  for diabetes management. - Advise adherence to Mediterranean diet and reduction of sweets intake.  Hyperlipidemia Managed with rosuvastatin  due  to diabetes, despite normal LDL levels. Last LDL measured at 6, indicating well-controlled cholesterol levels. - Continue rosuvastatin  for hyperlipidemia management.   Will follow up with results of study        Signed, Oneil Parchment, MD

## 2024-04-16 ENCOUNTER — Ambulatory Visit: Payer: Self-pay | Admitting: Cardiology

## 2024-04-16 LAB — BASIC METABOLIC PANEL WITH GFR
BUN/Creatinine Ratio: 24 (ref 12–28)
BUN: 17 mg/dL (ref 8–27)
CO2: 23 mmol/L (ref 20–29)
Calcium: 9.2 mg/dL (ref 8.7–10.3)
Chloride: 100 mmol/L (ref 96–106)
Creatinine, Ser: 0.7 mg/dL (ref 0.57–1.00)
Glucose: 129 mg/dL — ABNORMAL HIGH (ref 70–99)
Potassium: 4.2 mmol/L (ref 3.5–5.2)
Sodium: 139 mmol/L (ref 134–144)
eGFR: 90 mL/min/1.73 (ref >59)

## 2024-05-23 ENCOUNTER — Other Ambulatory Visit: Payer: Self-pay | Admitting: Physician Assistant

## 2024-05-23 DIAGNOSIS — Z1231 Encounter for screening mammogram for malignant neoplasm of breast: Secondary | ICD-10-CM

## 2024-05-31 ENCOUNTER — Encounter (HOSPITAL_COMMUNITY): Payer: Self-pay

## 2024-06-11 ENCOUNTER — Encounter: Payer: Self-pay | Admitting: Internal Medicine

## 2024-06-11 ENCOUNTER — Other Ambulatory Visit

## 2024-06-11 ENCOUNTER — Ambulatory Visit: Admitting: Internal Medicine

## 2024-06-11 VITALS — BP 110/70 | HR 78 | Resp 18 | Ht 62.0 in | Wt 152.6 lb

## 2024-06-11 DIAGNOSIS — E1165 Type 2 diabetes mellitus with hyperglycemia: Secondary | ICD-10-CM

## 2024-06-11 DIAGNOSIS — E785 Hyperlipidemia, unspecified: Secondary | ICD-10-CM

## 2024-06-11 DIAGNOSIS — Z794 Long term (current) use of insulin: Secondary | ICD-10-CM

## 2024-06-11 LAB — LIPID PANEL W/REFLEX DIRECT LDL
Cholesterol: 159 mg/dL (ref <200)
HDL: 56 mg/dL (ref >=50)
LDL Cholesterol (Calc): 80 mg/dL
Non-HDL Cholesterol (Calc): 103 mg/dL (ref <130)
Total CHOL/HDL Ratio: 2.8 (calc) (ref <5.0)
Triglycerides: 136 mg/dL (ref <150)

## 2024-06-11 LAB — POCT GLYCOSYLATED HEMOGLOBIN (HGB A1C): Hemoglobin A1C: 7.2 % — AB (ref 4.0–5.6)

## 2024-06-11 MED ORDER — LEVEMIR FLEXTOUCH 100 UNIT/ML ~~LOC~~ SOPN: 60.0000 [IU] | PEN_INJECTOR | SUBCUTANEOUS | Status: AC

## 2024-06-11 NOTE — Progress Notes (Signed)
 Patient ID: Dorothy David, female   DOB: 28-Dec-1950, 73 y.o.   MRN: [id number]  HPI: Dorothy David is a 73 y.o.-year-old female, returning for follow-up for DM2, dx in 2002, insulin -dependent since dx., uncontrolled, without long-term complications. Pt. previously saw Dr. Kassie, but last visit with me 3 months ago.  Interim history: No increased urination, blurry vision, nausea, chest pain.  She had chest discomfort >> saw cardiology 1 mo ago.  She will have a calcium  score checked soon.  Reviewed HbA1c: Lab Results  Component Value Date   HGBA1C 7.8 (A) 02/08/2024   HGBA1C 7.9 (A) 07/20/2023   HGBA1C 8.7 (A) 03/20/2023   HGBA1C 8.5 (A) 09/16/2022   HGBA1C 9.8 (A) 05/20/2022   HGBA1C 9.3 (A) 12/03/2021   HGBA1C 8.6 (A) 08/25/2021   HGBA1C 8.6 (A) 05/25/2021   HGBA1C 7.9 (A) 02/19/2021   HGBA1C 8.0 (A) 12/08/2020  11/07/2023: HbA1c 7.9% ([facility]) 02/04/2022: HbA1c calculated from fructosamine 7.68%.  Pt is on a regimen of: - Tradjenta 5 mg before b'fast  >> Trulicity  0.75 mg weekly-started 05/2022 >> 1.5mg  weekly (higher doses are more $$$) >Mounjaro  2.5 >> 5 mg weekly (just increased to 5 mg 1 mo ago) - Levemir  70 >> 78 >> 70 >> 74  units in am  - frequently forgetting in the past - now daily >> 60 units daily She had vaginitis from [place]. She had nausea from Rybelsus. She had nausea and vomiting from Ozempic . She was started in Trulicity  in the past >> tolerated well. She could not tolerate metformin  ER even at the lowest dose due to diarrhea.  Pt was checking blood sugars >4x a day with her Freestyle Libre CGM - stopped as she could not get it to work...: - am: 80-110 - 2h after b'fast: ? - lunch: 140-200s - 2h after lunch: ? - dinner: ? - 2h after dinner: ? - bedtime: ?   Previously:  Previously:  Lowest sugar was 30s (CGM) x1 >> 60 >> ?; she has hypoglycemia awareness at 70.  Highest sugar was 300s >> 300 >> ?  - no CKD, last BUN/creatinine:  Lab Results  Component  Value Date   BUN 17 04/15/2024   BUN 13 09/25/2022   CREATININE 0.70 04/15/2024   CREATININE 0.64 09/25/2022   Lab Results  Component Value Date   MICRALBCREAT 11.2 11/07/2023  She is not on an ACE inhibitor/ARB.  She had cough with lisinopril.  - + HL; last set of lipids:  No results found for: CHOL, HDL, LDLCALC, LDLDIRECT, TRIG, CHOLHDL On Crestor  10 mg daily.  - last eye exam was in 07/2022. No DR reportedly. Mild Cataract.  - no numbness and tingling in her feet.  Last foot exam 02/08/2024.  She has a history of lichen sclerosus.  ROS: + see HPI  Past Medical History:  Diagnosis Date   Anxiety    Atrophic vaginitis    Depression    Diabetes 1.5, 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HCC)    DM (diabetes mellitus) (HCC)    Finger infection 09/24/2022   Flexor tenosynovitis of finger 09/24/2022   GERD (gastroesophageal reflux disease)    Hypercholesterolemia    Hyperlipidemia 09/24/2022   Lichen sclerosus    Neuropathy    Past Surgical History:  Procedure Laterality Date   ABDOMINAL HYSTERECTOMY     I & D EXTREMITY Right 09/24/2022   Procedure: IRRIGATION AND DEBRIDEMENT RIGHT MIDDLE FINGER;  Surgeon: Romona Harari, MD;  Location: [facility] OR;  Service:  Orthopedics;  Laterality: Right;   TONSILLECTOMY     Social History   Socioeconomic History   Marital status: Married    Spouse name: Not on file   Number of children: Not on file   Years of education: Not on file   Highest education level: Not on file  Occupational History   Not on file  Tobacco Use   Smoking status: Never   Smokeless tobacco: Never  Substance and Sexual Activity   Alcohol use: Yes    Comment: Rare   Drug use: Not Currently   Sexual activity: Not Currently  Other Topics Concern   Not on file  Social History Narrative   Not on file   Social Drivers of Health   Financial Resource Strain: Low Risk  (04/06/2022)   Overall Financial Resource Strain (CARDIA)    Difficulty of Paying Living  Expenses: Not hard at all  Food Insecurity: No Food Insecurity (04/06/2022)   Hunger Vital Sign    Worried About Running Out of Food in the Last Year: Never true    Ran Out of Food in the Last Year: Never true  Transportation Needs: No Transportation Needs (04/06/2022)   PRAPARE - Administrator, Civil Service (Medical): No    Lack of Transportation (Non-Medical): No  Physical Activity: Not on file  Stress: Not on file  Social Connections: Not on file  Intimate Partner Violence: Not on file   Current Outpatient Medications on File Prior to Visit  Medication Sig Dispense Refill   ACCU-CHEK GUIDE TEST test strip Use to check blood sugar once a day DxCode:E11.65 100 each 12   Accu-Chek Softclix Lancets lancets Use to check blood sugar once a day DxCode:E11.65 100 each 12   Blood Glucose Monitoring Suppl (ACCU-CHEK GUIDE) w/Device KIT Use to check blood sugar once a day DxCode:E11.65 1 kit 0   buPROPion  (WELLBUTRIN  XL) 300 MG 24 hr tablet Take 300 mg by mouth daily. (Patient not taking: Reported on 04/15/2024)     chlorhexidine  (HIBICLENS ) 4 % external liquid Apply topically 2 (two) times daily. (Patient not taking: Reported on 04/15/2024) 120 mL 0   clobetasol ointment (TEMOVATE) 0.05 % SMARTSIG:1 Topical Every Night     Continuous Glucose Sensor (FREESTYLE LIBRE 3 PLUS SENSOR) MISC Inject 1 Device into the skin continuous. Change every 15 days 6 each 3   ibuprofen (ADVIL,MOTRIN) 200 MG tablet Take 200 mg by mouth every 6 (six) hours as needed (pain).     insulin  detemir (LEVEMIR  FLEXTOUCH) 100 UNIT/ML FlexPen Inject 70 Units into the skin every morning.     metoprolol  tartrate (LOPRESSOR ) 100 MG tablet Take 1 tablet (100 mg total) by mouth as directed. Take 1 tablet two hours before your CT scan 1 tablet 0   omeprazole (PRILOSEC) 40 MG capsule Take 40 mg by mouth daily. (Patient not taking: Reported on 04/15/2024)     rosuvastatin  (CRESTOR ) 10 MG tablet Take 10 mg by mouth daily.      simvastatin  (ZOCOR ) 40 MG tablet Take 40 mg by mouth daily. (Patient not taking: Reported on 04/15/2024)     tirzepatide  (MOUNJARO ) 5 MG/0.5ML Pen Inject 5 mg into the skin once a week. 6 mL 3   No current facility-administered medications on file prior to visit.   Allergies  Allergen Reactions   Atorvastatin Other (See Comments)   Codeine     Hot flashes   Dulaglutide  Other (See Comments)   Empagliflozin     Other Reaction(s):  made Lichen Planus worse   Lisinopril Other (See Comments)   Metformin      Other Reaction(s): diarhea at high doses   Family History  Problem Relation Age of Onset   Breast cancer Sister 60   BRCA 1/2 Neg Hx    PE: BP 110/70   Pulse 78   Resp 18   Ht 5' 2 (1.575 m)   Wt 152 lb 9.6 oz (69.2 kg)   SpO2 97%   BMI 27.91 kg/m  Wt Readings from Last 3 Encounters:  06/11/24 152 lb 9.6 oz (69.2 kg)  04/15/24 149 lb (67.6 kg)  02/08/24 156 lb 12.8 oz (71.1 kg)   Constitutional: Slightly overweight, in NAD Eyes: no exophthalmos ENT: no thyromegaly, no cervical lymphadenopathy Cardiovascular: RRR, No MRG Respiratory: CTA B Musculoskeletal: no deformities Skin:  no rashes Neurological: no tremor with outstretched hands  ASSESSMENT: 1. DM2, insulin -dependent, uncontrolled, without long-term complications, but with hyperglycemia  2. HL  PLAN:  1. Patient with longstanding, uncontrolled, type 2 diabetes, on weekly GLP-1/GIP receptor agonist and daily long-acting insulin , with slightly better control at last visit, when HbA1c returned 7.8%.  She previously had very sparse CGM data due to data loss from approximately 7 AM to midnight.  I advised her to scan the device every 8 hours.  However, at last visit, she was not checking blood sugars at all and was off the sensor.  She did not have a meter at home.  I strongly advised her to get a new meter.  I advised her to increase the dose of Mounjaro . -We had a lot of problems obtaining her GLP-1 receptor agonist  in the past.  She was not able to use Trulicity  consistently in the past due to high price.  Ozempic  and Rybelsus were not well-tolerated in the past, but they were expensive.  Currently doing well on Mounjaro .  However, she mentions that she just increased the dose of Mounjaro  approximately a month ago, as she still had the 2.5 mg dose and she used this, without doubling the dose.  Not having a CGM on, it is difficult to understand.,  But she is checking some sugars manually and she mentions that she is waking up with blood sugars at goal but they increase later in the day.  At today's visit, HbA1c is much better, in fact it is better in a long time, close to target so I did not recommend a change in regimen for now.  She had the sensor on today but this was not communicating with her phone.  I scanned her sensor today and it is supposed to start working in an hour. - I suggested to:  Patient Instructions  Please continue: - Mounjaro  5 mg weekly - Levemir  60 units in am  You need an eye exam.   Please return for another visit in 2-3 months.  - we checked her HbA1c: 7.2% (best in a long time) - advised to check sugars at different times of the day - 4x a day, rotating check times - advised for yearly eye exams >> she is not UTD  - return to clinic in 2-3 months  2. HL - Latest lipid panel was reviewed from 10/2022 and fractions were at goal No results found for: CHOL, HDL, LDLCALC, LDLDIRECT, TRIG, CHOLHDL -She continues Crestor  10 mg daily without side effects - She is due for another lipid panel.  Will check this today.  She is fasting.  Lela Fendt, MD PhD [facility]

## 2024-06-11 NOTE — Patient Instructions (Addendum)
 Please continue: - Mounjaro  5 mg weekly - Levemir  60 units in am  You need an eye exam.   Please return for another visit in 2-3 months.

## 2024-06-12 ENCOUNTER — Ambulatory Visit: Payer: Self-pay | Admitting: Internal Medicine

## 2024-06-14 ENCOUNTER — Telehealth (HOSPITAL_COMMUNITY): Payer: Self-pay | Admitting: *Deleted

## 2024-06-14 ENCOUNTER — Other Ambulatory Visit (HOSPITAL_COMMUNITY): Payer: Self-pay | Admitting: *Deleted

## 2024-06-14 MED ORDER — METOPROLOL TARTRATE 50 MG PO TABS: ORAL_TABLET | ORAL | 0 refills | Status: AC

## 2024-06-14 NOTE — Telephone Encounter (Signed)
 Attempted to call patient regarding upcoming cardiac CT appointment. VM full.  Larey Brick RN Navigator Cardiac Imaging [facility] [phone] Office [phone] Cell

## 2024-06-14 NOTE — Telephone Encounter (Signed)
 Reaching out to patient to offer assistance regarding upcoming cardiac imaging study; pt verbalizes understanding of appt date/time, parking situation and where to check in, pre-test NPO status and medications ordered, and verified current allergies; name and call back number provided for further questions should they arise  Chantal Requena RN Navigator Cardiac Imaging [facility] [phone] office [phone] cell

## 2024-06-17 ENCOUNTER — Ambulatory Visit (HOSPITAL_COMMUNITY)
Admission: RE | Admit: 2024-06-17 | Discharge: 2024-06-17 | Disposition: A | Discharge status: Home / Self Care | Source: Ambulatory Visit | Attending: Cardiology | Admitting: Cardiology

## 2024-06-17 DIAGNOSIS — R072 Precordial pain: Secondary | ICD-10-CM | POA: Insufficient documentation

## 2024-06-17 LAB — POCT I-STAT CREATININE: Creatinine, Ser: 0.8 mg/dL (ref 0.44–1.00)

## 2024-06-17 MED ORDER — IOHEXOL 350 MG/ML SOLN
95.0000 mL | Freq: Once | INTRAVENOUS | Status: AC | PRN
  Administered 2024-06-17: 95 mL via INTRAVENOUS

## 2024-06-17 MED ORDER — NITROGLYCERIN 0.4 MG SL SUBL
0.8000 mg | SUBLINGUAL_TABLET | Freq: Once | SUBLINGUAL | Status: AC
  Administered 2024-06-17: 0.8 mg via SUBLINGUAL

## 2024-06-18 ENCOUNTER — Ambulatory Visit: Payer: Self-pay | Admitting: Cardiology

## 2024-06-20 ENCOUNTER — Encounter: Payer: Self-pay | Admitting: *Deleted

## 2024-06-20 ENCOUNTER — Ambulatory Visit
Admission: RE | Admit: 2024-06-20 | Discharge: 2024-06-20 | Disposition: A | Source: Ambulatory Visit | Attending: Physician Assistant | Admitting: Physician Assistant

## 2024-06-20 DIAGNOSIS — Z1231 Encounter for screening mammogram for malignant neoplasm of breast: Secondary | ICD-10-CM

## 2024-09-11 ENCOUNTER — Ambulatory Visit: Admitting: Internal Medicine
